# Patient Record
Sex: Female | Born: 1935 | Race: White | Hispanic: No | Smoking: Former smoker
Health system: Southern US, Community
[De-identification: ages and names within clinical notes are randomized; demographics above are authoritative.]

## PROBLEM LIST (undated history)

## (undated) DIAGNOSIS — F0390 Unspecified dementia without behavioral disturbance: Secondary | ICD-10-CM

## (undated) DIAGNOSIS — E785 Hyperlipidemia, unspecified: Secondary | ICD-10-CM

## (undated) DIAGNOSIS — F329 Major depressive disorder, single episode, unspecified: Secondary | ICD-10-CM

## (undated) DIAGNOSIS — I2510 Atherosclerotic heart disease of native coronary artery without angina pectoris: Secondary | ICD-10-CM

## (undated) DIAGNOSIS — Z950 Presence of cardiac pacemaker: Secondary | ICD-10-CM

## (undated) DIAGNOSIS — I4891 Unspecified atrial fibrillation: Secondary | ICD-10-CM

## (undated) DIAGNOSIS — I499 Cardiac arrhythmia, unspecified: Secondary | ICD-10-CM

## (undated) DIAGNOSIS — F32A Depression, unspecified: Secondary | ICD-10-CM

## (undated) HISTORY — PX: BACK SURGERY: SHX140

## (undated) HISTORY — PX: PACEMAKER INSERTION: SHX728

## (undated) HISTORY — PX: VAGINAL HYSTERECTOMY: SHX2639

---

## 2005-10-08 ENCOUNTER — Ambulatory Visit: Payer: Self-pay | Admitting: Cardiology

## 2006-06-26 ENCOUNTER — Ambulatory Visit: Payer: Self-pay

## 2015-10-28 ENCOUNTER — Emergency Department
Admission: EM | Admit: 2015-10-28 | Discharge: 2015-10-28 | Disposition: A | Discharge status: Home / Self Care | Payer: Medicare Other | Attending: Emergency Medicine | Admitting: Emergency Medicine

## 2015-10-28 ENCOUNTER — Emergency Department: Payer: Medicare Other

## 2015-10-28 ENCOUNTER — Encounter: Payer: Self-pay | Admitting: Emergency Medicine

## 2015-10-28 DIAGNOSIS — I4891 Unspecified atrial fibrillation: Secondary | ICD-10-CM | POA: Diagnosis not present

## 2015-10-28 DIAGNOSIS — Z950 Presence of cardiac pacemaker: Secondary | ICD-10-CM | POA: Diagnosis not present

## 2015-10-28 DIAGNOSIS — R410 Disorientation, unspecified: Secondary | ICD-10-CM | POA: Diagnosis present

## 2015-10-28 DIAGNOSIS — N390 Urinary tract infection, site not specified: Secondary | ICD-10-CM | POA: Insufficient documentation

## 2015-10-28 DIAGNOSIS — Z87891 Personal history of nicotine dependence: Secondary | ICD-10-CM | POA: Diagnosis not present

## 2015-10-28 HISTORY — DX: Cardiac arrhythmia, unspecified: I49.9

## 2015-10-28 HISTORY — DX: Unspecified atrial fibrillation: I48.91

## 2015-10-28 HISTORY — DX: Unspecified dementia, unspecified severity, without behavioral disturbance, psychotic disturbance, mood disturbance, and anxiety: F03.90

## 2015-10-28 HISTORY — DX: Presence of cardiac pacemaker: Z95.0

## 2015-10-28 LAB — COMPREHENSIVE METABOLIC PANEL
ALBUMIN: 3.5 g/dL (ref 3.5–5.0)
ALK PHOS: 91 U/L (ref 38–126)
ALT: 91 U/L — ABNORMAL HIGH (ref 14–54)
AST: 93 U/L — AB (ref 15–41)
Anion gap: 10 (ref 5–15)
BILIRUBIN TOTAL: 1.3 mg/dL — AB (ref 0.3–1.2)
BUN: 14 mg/dL (ref 6–20)
CALCIUM: 9.1 mg/dL (ref 8.9–10.3)
CO2: 30 mmol/L (ref 22–32)
CREATININE: 1.02 mg/dL — AB (ref 0.44–1.00)
Chloride: 97 mmol/L — ABNORMAL LOW (ref 101–111)
GFR calc Af Amer: 57 mL/min — ABNORMAL LOW (ref >60)
GFR calc non Af Amer: 49 mL/min — ABNORMAL LOW (ref >60)
GLUCOSE: 103 mg/dL — AB (ref 65–99)
Potassium: 3.2 mmol/L — ABNORMAL LOW (ref 3.5–5.1)
Sodium: 137 mmol/L (ref 135–145)
TOTAL PROTEIN: 7.3 g/dL (ref 6.5–8.1)

## 2015-10-28 LAB — URINALYSIS COMPLETE WITH MICROSCOPIC (ARMC ONLY)
BILIRUBIN URINE: NEGATIVE
Bacteria, UA: NONE SEEN
Glucose, UA: NEGATIVE mg/dL
Hgb urine dipstick: NEGATIVE
KETONES UR: NEGATIVE mg/dL
Nitrite: NEGATIVE
PROTEIN: NEGATIVE mg/dL
Specific Gravity, Urine: 1.005 (ref 1.005–1.030)
pH: 8 (ref 5.0–8.0)

## 2015-10-28 LAB — CBC
HCT: 33.6 % — ABNORMAL LOW (ref 35.0–47.0)
Hemoglobin: 11.5 g/dL — ABNORMAL LOW (ref 12.0–16.0)
MCH: 30.8 pg (ref 26.0–34.0)
MCHC: 34.1 g/dL (ref 32.0–36.0)
MCV: 90.2 fL (ref 80.0–100.0)
PLATELETS: 274 10*3/uL (ref 150–440)
RBC: 3.72 MIL/uL — ABNORMAL LOW (ref 3.80–5.20)
RDW: 16.6 % — AB (ref 11.5–14.5)
WBC: 5.8 10*3/uL (ref 3.6–11.0)

## 2015-10-28 LAB — PROTIME-INR
INR: 2.72
PROTHROMBIN TIME: 28.4 s — AB (ref 11.4–15.0)

## 2015-10-28 MED ORDER — CEPHALEXIN 500 MG PO CAPS: 500.0000 mg | ORAL_CAPSULE | Freq: Three times a day (TID) | ORAL | Status: AC

## 2015-10-28 MED ORDER — DEXTROSE 5 % IV SOLN
1.0000 g | Freq: Once | INTRAVENOUS | Status: AC
  Administered 2015-10-28: 1 g via INTRAVENOUS
  Filled 2015-10-28: qty 10

## 2015-10-28 MED ORDER — ACETAMINOPHEN 325 MG PO TABS
650.0000 mg | ORAL_TABLET | Freq: Once | ORAL | Status: AC
  Administered 2015-10-28: 650 mg via ORAL
  Filled 2015-10-28: qty 2

## 2015-10-28 NOTE — Discharge Instructions (Signed)
You would prefer to go home and be admitted to the hospital which is not unreasonable but the very vigilant. If there is confusion, high fever, nausea, vomiting, abdominal pain, or you feel worse in any way return to the emergency room. Follow closely with your doctor in 2 days to have a repeat INR checked as well as to be reevaluated.

## 2015-10-28 NOTE — ED Provider Notes (Signed)
[facility] Emergency Department Provider Note  ____________________________________________   I have reviewed the triage vital signs and the nursing notes.   HISTORY  Chief Complaint Altered Mental Status    HPI Ashley Mullins is a 82 y.o. female with a history ofdementia and urinary tract infections in the past presents today with some mild confusion this morning. She does have history of recurrent falls. She is on Coumadin. Patient denies confusion. She denies hitting her head. She does have some bruises to her arms and legs denies falling. She has no complaints, she would like to go home. Daughter states that this time she is back to her baseline but apparently she was somewhat confused when she woke up this morning. No further history. History is limited by patient dementia.  Level V chart Caveat   Past Medical History  Diagnosis Date  . Dementia   . Pacemaker   . Irregular heart beat   . A-fib (HCC)     There are no active problems to display for this patient.   Past Surgical History  Procedure Laterality Date  . Pacemaker insertion    . Vaginal hysterectomy    . Back surgery      Current Outpatient Rx  Name  Route  Sig  Dispense  Refill  . cephALEXin (KEFLEX) 500 MG capsule   Oral   Take 1 capsule (500 mg total) by mouth 3 (three) times daily.   21 capsule   0     Allergies Codeine  History reviewed. No pertinent family history.  Social History Social History  Substance Use Topics  . Smoking status: Former Games developer  . Smokeless tobacco: Never Used  . Alcohol Use: No    Review of Systems Constitutional: No fever/chills Eyes: No visual changes. ENT: No sore throat. No stiff neck no neck pain Cardiovascular: Denies chest pain. Respiratory: Denies shortness of breath. Gastrointestinal:   no vomiting.  No diarrhea.  No constipation. Genitourinary: Negative for dysuria. Musculoskeletal: Negative lower  extremity swelling Skin: Negative for rash. Neurological: Negative for headaches, focal weakness or numbness. 10-point ROS otherwise negative.  ____________________________________________   PHYSICAL EXAM:  VITAL SIGNS: ED Triage Vitals  Enc Vitals Group     BP 10/28/15 1330 140/70 mmHg     Pulse Rate 10/28/15 1326 71     Resp 10/28/15 1326 18     Temp 10/28/15 1326 97.7 F (36.5 C)     Temp Source 10/28/15 1326 Oral     SpO2 10/28/15 1326 97 %     Weight 10/28/15 1326 134 lb (60.782 kg)     Height 10/28/15 1326  (1.626 m)     Head Cir --      Peak Flow --      Pain Score 10/28/15 1328 7     Pain Loc --      Pain Edu? --      Excl. in GC? --     Constitutional: Alert and orientedTo name and place unsure of the day but knows it is Mother's Day, can identify her family members.  Well appearing and in no acute distress. Eyes: Conjunctivae are normal. PERRL. EOMI. Head: Atraumatic. Nose: No congestion/rhinnorhea. Mouth/Throat: Mucous membranes are moist.  Oropharynx non-erythematous. Neck: No stridor.   Nontender with no meningismus Cardiovascular: Normal rate, regular rhythm. Grossly normal heart sounds.  Good peripheral circulation. Respiratory: Normal respiratory effort.  No retractions. Lungs CTAB. Abdominal: Soft and nontender. No distention. No guarding no rebound  Back:  There is no focal tenderness or step off there is no midline tenderness there are no lesions noted. there is no CVA tendernes Musculoskeletal: No lower extremity tenderness. No joint effusions, no DVT signs strong distal pulses no edema Neurologic:  Normal speech and language. No gross focal neurologic deficits are appreciated.  Skin:  Skin is warm, dry and intact.  Several bruises in various stages of healing to the upper arm and one to the right medial thigh Psychiatric: Mood and affect are normal. Speech and behavior are normal.  ____________________________________________   LABS (all labs  ordered are listed, but only abnormal results are displayed)  Labs Reviewed  COMPREHENSIVE METABOLIC PANEL - Abnormal; Notable for the following:    Potassium 3.2 (*)    Chloride 97 (*)    Glucose, Bld 103 (*)    Creatinine, Ser 1.02 (*)    AST 93 (*)    ALT 91 (*)    Total Bilirubin 1.3 (*)    GFR calc non Af Amer 49 (*)    GFR calc Af Amer 57 (*)    All other components within normal limits  CBC - Abnormal; Notable for the following:    RBC 3.72 (*)    Hemoglobin 11.5 (*)    HCT 33.6 (*)    RDW 16.6 (*)    All other components within normal limits  PROTIME-INR - Abnormal; Notable for the following:    Prothrombin Time 28.4 (*)    All other components within normal limits  URINALYSIS COMPLETEWITH MICROSCOPIC ([facility] ONLY) - Abnormal; Notable for the following:    Color, Urine YELLOW (*)    APPearance CLEAR (*)    Leukocytes, UA 3+ (*)    Squamous Epithelial / LPF 0-5 (*)    All other components within normal limits  URINE CULTURE   ____________________________________________  EKG  I personally interpreted any EKGs ordered by me or triage Occasionally paced rhythm, no acute ST elevation or depression  ____________________________________________  RADIOLOGY  I reviewed any imaging ordered by me or triage that were performed during my shift and, if possible, patient and/or family made aware of any abnormal findings. ____________________________________________   PROCEDURES  Procedure(s) performed: None  Critical Care performed: None  ____________________________________________   INITIAL IMPRESSION / ASSESSMENT AND PLAN / ED COURSE  Pertinent labs & imaging results that were available during my care of the patient were reviewed by me and considered in my medical decision making (see chart for detaPatient with UTI and apparently some mild confusion which is completely resolved. I did offer admission to the hospital with family and patient would very much prefer to  go home. Vital signs and workup otherwise reassuring. We will give her antibiotics here, we will write her for Keflex. They understand this can in some ways influence her INR and they must have her INR rechecked. They will watch the patient very vigilantly for_Recurrent confusion, and they will return to the emergency room for any new or worrisome symptoms. Patient is not confused at this time and eager to go home.___________   FINAL CLINICAL IMPRESSION(S) / ED DIAGNOSES  Final diagnoses:  UTI (lower urinary tract infection)      This chart was dictated using voice recognition software.  Despite best efforts to proofread,  errors can occur which can change meaning.     Jeanmarie PlantJames A Mahoganie Basher, MD 10/28/15 [phone]

## 2015-10-28 NOTE — ED Notes (Signed)
Discussed discharge instructions, prescriptions, and follow-up care with patient and pt's family, with permission. No questions or concerns at this time. Pt stable at discharge.

## 2015-10-28 NOTE — ED Notes (Signed)
Pt presents to ED via POV with her daughter. Pt's daughter states that patient has had several falls recently and noticed that today patient seemed confused. Upon assessment pt is disoriented to time, and place, pt continues to state "I know I'm not confused" but patient is unable to answer questions at this time. Pt's daughter states that she is on coumadin.

## 2015-10-30 LAB — URINE CULTURE

## 2016-03-19 ENCOUNTER — Encounter: Payer: Self-pay | Admitting: *Deleted

## 2016-03-19 ENCOUNTER — Emergency Department
Admission: EM | Admit: 2016-03-19 | Discharge: 2016-03-19 | Disposition: A | Discharge status: Home / Self Care | Payer: Medicare Other | Attending: Student in an Organized Health Care Education/Training Program | Admitting: Student in an Organized Health Care Education/Training Program

## 2016-03-19 DIAGNOSIS — X58XXXA Exposure to other specified factors, initial encounter: Secondary | ICD-10-CM | POA: Diagnosis not present

## 2016-03-19 DIAGNOSIS — Z87891 Personal history of nicotine dependence: Secondary | ICD-10-CM | POA: Insufficient documentation

## 2016-03-19 DIAGNOSIS — R040 Epistaxis: Secondary | ICD-10-CM | POA: Diagnosis present

## 2016-03-19 DIAGNOSIS — Y939 Activity, unspecified: Secondary | ICD-10-CM | POA: Diagnosis not present

## 2016-03-19 DIAGNOSIS — S01511A Laceration without foreign body of lip, initial encounter: Secondary | ICD-10-CM | POA: Diagnosis not present

## 2016-03-19 DIAGNOSIS — Y999 Unspecified external cause status: Secondary | ICD-10-CM | POA: Diagnosis not present

## 2016-03-19 DIAGNOSIS — Y929 Unspecified place or not applicable: Secondary | ICD-10-CM | POA: Diagnosis not present

## 2016-03-19 MED ORDER — LIDOCAINE HCL 2 % EX GEL
1.0000 "application " | Freq: Once | CUTANEOUS | Status: DC
  Filled 2016-03-19: qty 5

## 2016-03-19 MED ORDER — LIDOCAINE-EPINEPHRINE 2 %-1:100000 IJ SOLN
20.0000 mL | Freq: Once | INTRAMUSCULAR | Status: DC
  Filled 2016-03-19: qty 20

## 2016-03-19 MED ORDER — LIDOCAINE-EPINEPHRINE (PF) 1 %-1:200000 IJ SOLN
INTRAMUSCULAR | Status: AC
  Filled 2016-03-19: qty 30

## 2016-03-19 NOTE — ED Triage Notes (Addendum)
[facility]. State she has been spitting up blood since this AM, pt on coumadin with hx of Afib, pt awake and alert upon arrival

## 2016-03-19 NOTE — ED Notes (Signed)
Pt resting in bed, no bleeding at present, pt awake and alert

## 2016-03-19 NOTE — ED Provider Notes (Signed)
[facility] Emergency Department Provider Note    First MD Initiated Contact with Patient 03/19/16 1743     (approximate)  I have reviewed the triage vital signs and the nursing notes.   HISTORY  Chief Complaint Epistaxis    HPI Ashley Mullins is a 82 y.o. female on Coumadin for history of A. fib presents with a chief complaint of nosebleed. States that she was coughing and spitting up blood since this morning. Denies any shortness of breath or lightheadedness. Arrives to the ER without any evidence of active bleeding from the nares. Denies any other spontaneous hemorrhage. Denies any trauma. Denies any history of previous nosebleeds.   Past Medical History:  Diagnosis Date  . A-fib (HCC)   . Dementia   . Irregular heart beat   . Pacemaker     There are no active problems to display for this patient.   Past Surgical History:  Procedure Laterality Date  . BACK SURGERY    . PACEMAKER INSERTION    . VAGINAL HYSTERECTOMY      Prior to Admission medications   Not on File    Allergies Codeine  History reviewed. No pertinent family history.  Social History Social History  Substance Use Topics  . Smoking status: Former Games developer  . Smokeless tobacco: Never Used  . Alcohol use No    Review of Systems Patient denies headaches, rhinorrhea, blurry vision, numbness, shortness of breath, chest pain, edema, cough, abdominal pain, nausea, vomiting, diarrhea, dysuria, fevers, rashes or hallucinations unless otherwise stated above in HPI. ____________________________________________   PHYSICAL EXAM:  VITAL SIGNS: Vitals:   03/19/16 1731  BP: (!) 154/62  Pulse: 66  Resp: 18  Temp: 98.1 F (36.7 C)    Constitutional: Alert and oriented. Well appearing and in no acute distress. Eyes: Conjunctivae are normal. PERRL. EOMI. Head: Atraumatic. Nose: No congestion/rhinnorhea. Evidence of epistaxis Mouth/Throat: Mucous membranes are  moist.  Oropharynx non-erythematous.  There is evidence of a less than 1 cm laceration with persistent oozing blood to the right anterior tip of the tongue.  The posterior hemorrhage. Neck: No stridor. Painless ROM. No cervical spine tenderness to palpation Hematological/Lymphatic/Immunilogical: No cervical lymphadenopathy. Cardiovascular: Normal rate, regular rhythm. Grossly normal heart sounds.  Good peripheral circulation. Respiratory: Normal respiratory effort.  No retractions. Lungs CTAB. Gastrointestinal: Soft and nontender. No distention. No abdominal bruits. No CVA tenderness. Genitourinary:  Musculoskeletal: No lower extremity tenderness nor edema.  No joint effusions. Neurologic:  Normal speech and language. No gross focal neurologic deficits are appreciated. No gait instability. Skin:  Skin is warm, dry and intact. No rash noted. Psychiatric: Mood and affect are normal. Speech and behavior are normal.  ____________________________________________   LABS (all labs ordered are listed, but only abnormal results are displayed)  No results found for this or any previous visit (from the past 24 hour(s)). ____________________________________________ ____________________________________________   PROCEDURES  Procedure(s) performed:  Procedures     Critical Care performed: no ____________________________________________   INITIAL IMPRESSION / ASSESSMENT AND PLAN / ED COURSE  Pertinent labs & imaging results that were available during my care of the patient were reviewed by me and considered in my medical decision making (see chart for details).  DDX: Epistaxis, nasal trauma, tongue laceration  Ashley Mullins is a 82 y.o. who presents to the ED with concern for epistaxis however on exam it appears more consistent that the bleeding is coming from her tongue. Plan local anesthesia and wound repair.  Clinical  Course  Comment By Time  Tongue laceration repaired at  bedside without, location. Patient tolerated well. No posterior oropharynx bleeding. Otherwise a hemostatic at this time. We'll observe for another few moments and if she remains hemostatic we'll discharge for outpatient follow-up.  Have discussed with the patient and available family all diagnostics and treatments performed thus far and all questions were answered to the best of my ability. The patient demonstrates understanding and agreement with plan.  Willy EddyPatrick Madalena Kesecker, MD 10/04 1924     ____________________________________________   FINAL CLINICAL IMPRESSION(S) / ED DIAGNOSES  Final diagnoses:  Lip laceration, initial encounter      NEW MEDICATIONS STARTED DURING THIS VISIT:  New Prescriptions   No medications on file     Note:  This document was prepared using Dragon voice recognition software and may include unintentional dictation errors.    Willy EddyPatrick Tyree Fluharty, MD 03/19/16 2021

## 2016-03-19 NOTE — Discharge Instructions (Signed)
Please follow up primary care physician. Return to the ER for worsening bleeding or concerns.

## 2016-03-24 ENCOUNTER — Encounter: Payer: Self-pay | Admitting: Emergency Medicine

## 2016-03-24 ENCOUNTER — Emergency Department
Admission: EM | Admit: 2016-03-24 | Discharge: 2016-03-24 | Disposition: A | Discharge status: Home / Self Care | Payer: Medicare Other | Attending: Emergency Medicine | Admitting: Emergency Medicine

## 2016-03-24 DIAGNOSIS — Y999 Unspecified external cause status: Secondary | ICD-10-CM | POA: Diagnosis not present

## 2016-03-24 DIAGNOSIS — Z87891 Personal history of nicotine dependence: Secondary | ICD-10-CM | POA: Insufficient documentation

## 2016-03-24 DIAGNOSIS — Y929 Unspecified place or not applicable: Secondary | ICD-10-CM | POA: Diagnosis not present

## 2016-03-24 DIAGNOSIS — W268XXA Contact with other sharp object(s), not elsewhere classified, initial encounter: Secondary | ICD-10-CM | POA: Diagnosis not present

## 2016-03-24 DIAGNOSIS — Z23 Encounter for immunization: Secondary | ICD-10-CM | POA: Insufficient documentation

## 2016-03-24 DIAGNOSIS — S61211A Laceration without foreign body of left index finger without damage to nail, initial encounter: Secondary | ICD-10-CM | POA: Diagnosis present

## 2016-03-24 DIAGNOSIS — Y9389 Activity, other specified: Secondary | ICD-10-CM | POA: Insufficient documentation

## 2016-03-24 DIAGNOSIS — Z950 Presence of cardiac pacemaker: Secondary | ICD-10-CM | POA: Diagnosis not present

## 2016-03-24 MED ORDER — TETANUS-DIPHTH-ACELL PERTUSSIS 5-2.5-18.5 LF-MCG/0.5 IM SUSP
0.5000 mL | Freq: Once | INTRAMUSCULAR | Status: AC
  Administered 2016-03-24: 0.5 mL via INTRAMUSCULAR
  Filled 2016-03-24: qty 0.5

## 2016-03-24 NOTE — ED Provider Notes (Signed)
[facility] Emergency Department Provider Note  ____________________________________________   First MD Initiated Contact with Patient 03/24/16 [phone]     (approximate)  I have reviewed the triage vital signs and the nursing notes.   HISTORY  Chief Complaint Laceration    HPI Ashley Mullins is a 82 y.o. female who takes Coumadin who presents forevaluation of a laceration to her left index finger.  She was clipping her toenails last night when the scissors slipped and cut her finger.  She washed it out thoroughly and tried to apply pressure but has continued to ooze blood during the night.  She has no other injuries.  She has no numbness nor tingling in the extremity.  The wound is relatively shallow but is just oozing blood.  Symptoms are mild, nothing makes better nor worse.     Past Medical History:  Diagnosis Date  . A-fib (HCC)   . Dementia   . Irregular heart beat   . Pacemaker     There are no active problems to display for this patient.   Past Surgical History:  Procedure Laterality Date  . BACK SURGERY    . PACEMAKER INSERTION    . VAGINAL HYSTERECTOMY      Prior to Admission medications   Not on File    Allergies Codeine  History reviewed. No pertinent family history.  Social History Social History  Substance Use Topics  . Smoking status: Former Games developer  . Smokeless tobacco: Never Used  . Alcohol use No    Review of Systems Constitutional: No fever/chills Cardiovascular: Denies chest pain. Respiratory: Denies shortness of breath. Gastrointestinal: No abdominal pain.  No nausea, no vomiting.  No diarrhea.  No constipation. Genitourinary: Negative for dysuria. Musculoskeletal: Negative for back pain. Skin: 1-cm laceration to left index finger Neurological: Negative for headaches, focal weakness or numbness.   ____________________________________________   PHYSICAL EXAM:  VITAL SIGNS: ED Triage Vitals  Enc  Vitals Group     BP 03/24/16 0604 133/65     Pulse Rate 03/24/16 0604 81     Resp 03/24/16 0604 18     Temp 03/24/16 0604 98 F (36.7 C)     Temp Source 03/24/16 0604 Oral     SpO2 03/24/16 0604 98 %     Weight 03/24/16 0605 131 lb (59.4 kg)     Height 03/24/16 0605 5\' 3"  (1.6 m)     Head Circumference --      Peak Flow --      Pain Score 03/24/16 0605 0     Pain Loc --      Pain Edu? --      Excl. in GC? --     Constitutional: Alert and oriented. Well appearing and in no acute distress. Eyes: Conjunctivae are normal. PERRL. EOMI. Head: Atraumatic. Cardiovascular: Normal rate, regular rhythm. Good peripheral circulation.  Respiratory: Normal respiratory effort.  No retractions.  Musculoskeletal: No lower extremity tenderness nor edema. No gross deformities of extremities. Neurologic:  Normal speech and language. No gross focal neurologic deficits are appreciated.  Skin:  Skin is warm, dry and intact.  1-cm shallow laceration on distal pad of left index finger. See procedure note Psychiatric: Mood and affect are normal. Speech and behavior are normal.  ____________________________________________   LABS (all labs ordered are listed, but only abnormal results are displayed)  Labs Reviewed - No data to display ____________________________________________  EKG  None - EKG not ordered by ED physician ____________________________________________  RADIOLOGY  No results found.  ____________________________________________   PROCEDURES  Procedure(s) performed:   Marland Kitchen.Marland Kitchen.Laceration Repair Date/Time: 03/24/2016 6:47 AM Performed by: Loleta RoseFORBACH, Laney Louderback Authorized by: Loleta RoseFORBACH, Mykel Sponaugle   Consent:    Consent obtained:  Verbal   Consent given by:  Patient Anesthesia (see MAR for exact dosages):    Anesthesia method:  None Laceration details:    Location:  Finger   Finger location:  L index finger   Length (cm):  1   Depth (mm):  1 Repair type:    Repair type:   Simple Exploration:    Contaminated: no   Treatment:    Amount of cleaning:  Standard   Irrigation solution:  Tap water   Visualized foreign bodies/material removed: no   Skin repair:    Repair method:  Tissue adhesive and Steri-Strips Approximation:    Vermilion border: well-aligned   Post-procedure details:    Dressing:  Open (no dressing)   Patient tolerance of procedure:  Tolerated well, no immediate complications     Critical Care performed: No ____________________________________________   INITIAL IMPRESSION / ASSESSMENT AND PLAN / ED COURSE  Pertinent labs & imaging results that were available during my care of the patient were reviewed by me and considered in my medical decision making (see chart for details).  Given that the patient is on Coumadin and oozing a little bit from the wound, I wanted to try to repair it with tissue adhesive and Steri-Strips if possible rather than introducing additional puncture wounds to her finger.  The wound is quite shallow and although it is on the finger which is somewhat atypical for adhesive and Steri-Strips, it is on the pad of her finger and not under any stress when it is bending or flexing.  Came together nicely with the tissue adhesive and I reinforced with 2 Steri-Strips and then a Band-Aid.  I made sure it is not too tight and she has normal capillary refill distally.  He gave my usual and customary recommendations and return precautions.  No indication for antibiotics.  The patient's daughter is not aware of when her last tetanus shot was and thinks it was greater than 5 years ago so I have given her a Tdap today.    ____________________________________________  FINAL CLINICAL IMPRESSION(S) / ED DIAGNOSES  Final diagnoses:  Laceration of left index finger without foreign body without damage to nail, initial encounter     MEDICATIONS GIVEN DURING THIS VISIT:  Medications  Tdap (BOOSTRIX) injection 0.5 mL (0.5 mLs  Intramuscular Given 03/24/16 0714)     NEW OUTPATIENT MEDICATIONS STARTED DURING THIS VISIT:  There are no discharge medications for this patient.   There are no discharge medications for this patient.   There are no discharge medications for this patient.    Note:  This document was prepared using Dragon voice recognition software and may include unintentional dictation errors.    Loleta Roseory Georga Stys, MD 03/24/16 [phone]

## 2016-03-24 NOTE — ED Triage Notes (Signed)
Pt was using scissors last night to clip her toenails when the scissors slipped; laceration to left index finger; pt unable to stop bleeding since incident occurred; pt takes Coumadin

## 2016-08-29 ENCOUNTER — Emergency Department
Admission: EM | Admit: 2016-08-29 | Discharge: 2016-08-29 | Disposition: A | Discharge status: Home / Self Care | Payer: Medicare Other | Attending: Emergency Medicine | Admitting: Emergency Medicine

## 2016-08-29 ENCOUNTER — Emergency Department: Payer: Medicare Other

## 2016-08-29 DIAGNOSIS — Z7901 Long term (current) use of anticoagulants: Secondary | ICD-10-CM | POA: Diagnosis not present

## 2016-08-29 DIAGNOSIS — F0391 Unspecified dementia with behavioral disturbance: Secondary | ICD-10-CM | POA: Insufficient documentation

## 2016-08-29 DIAGNOSIS — Z950 Presence of cardiac pacemaker: Secondary | ICD-10-CM | POA: Insufficient documentation

## 2016-08-29 DIAGNOSIS — Z79899 Other long term (current) drug therapy: Secondary | ICD-10-CM | POA: Diagnosis not present

## 2016-08-29 DIAGNOSIS — Z87891 Personal history of nicotine dependence: Secondary | ICD-10-CM | POA: Diagnosis not present

## 2016-08-29 DIAGNOSIS — R443 Hallucinations, unspecified: Secondary | ICD-10-CM | POA: Diagnosis present

## 2016-08-29 DIAGNOSIS — F0392 Unspecified dementia, unspecified severity, with psychotic disturbance: Secondary | ICD-10-CM

## 2016-08-29 DIAGNOSIS — Z7982 Long term (current) use of aspirin: Secondary | ICD-10-CM | POA: Diagnosis not present

## 2016-08-29 LAB — CBC
HCT: 36.5 % (ref 35.0–47.0)
HEMOGLOBIN: 12.3 g/dL (ref 12.0–16.0)
MCH: 29.6 pg (ref 26.0–34.0)
MCHC: 33.8 g/dL (ref 32.0–36.0)
MCV: 87.7 fL (ref 80.0–100.0)
PLATELETS: 246 10*3/uL (ref 150–440)
RBC: 4.16 MIL/uL (ref 3.80–5.20)
RDW: 16.3 % — ABNORMAL HIGH (ref 11.5–14.5)
WBC: 7.7 10*3/uL (ref 3.6–11.0)

## 2016-08-29 LAB — URINE DRUG SCREEN, QUALITATIVE (ARMC ONLY)
Amphetamines, Ur Screen: NOT DETECTED
Barbiturates, Ur Screen: NOT DETECTED
Benzodiazepine, Ur Scrn: NOT DETECTED
COCAINE METABOLITE, UR ~~LOC~~: NOT DETECTED
Cannabinoid 50 Ng, Ur ~~LOC~~: NOT DETECTED
MDMA (ECSTASY) UR SCREEN: NOT DETECTED
Methadone Scn, Ur: NOT DETECTED
OPIATE, UR SCREEN: NOT DETECTED
PHENCYCLIDINE (PCP) UR S: NOT DETECTED
TRICYCLIC, UR SCREEN: NOT DETECTED

## 2016-08-29 LAB — URINALYSIS, COMPLETE (UACMP) WITH MICROSCOPIC
BILIRUBIN URINE: NEGATIVE
Bacteria, UA: NONE SEEN
GLUCOSE, UA: NEGATIVE mg/dL
HGB URINE DIPSTICK: NEGATIVE
Ketones, ur: NEGATIVE mg/dL
NITRITE: NEGATIVE
PH: 7 (ref 5.0–8.0)
Protein, ur: NEGATIVE mg/dL
SPECIFIC GRAVITY, URINE: 1.005 (ref 1.005–1.030)

## 2016-08-29 LAB — PROTIME-INR
INR: 2.69
PROTHROMBIN TIME: 29.1 s — AB (ref 11.4–15.2)

## 2016-08-29 LAB — ETHANOL: Alcohol, Ethyl (B): <5 mg/dL (ref <5)

## 2016-08-29 LAB — TSH: TSH: 1.528 u[IU]/mL (ref 0.350–4.500)

## 2016-08-29 LAB — BASIC METABOLIC PANEL
ANION GAP: 8 (ref 5–15)
BUN: 20 mg/dL (ref 6–20)
CHLORIDE: 102 mmol/L (ref 101–111)
CO2: 29 mmol/L (ref 22–32)
Calcium: 8.9 mg/dL (ref 8.9–10.3)
Creatinine, Ser: 1.34 mg/dL — ABNORMAL HIGH (ref 0.44–1.00)
GFR calc Af Amer: 41 mL/min — ABNORMAL LOW (ref >60)
GFR calc non Af Amer: 35 mL/min — ABNORMAL LOW (ref >60)
Glucose, Bld: 116 mg/dL — ABNORMAL HIGH (ref 65–99)
POTASSIUM: 3.3 mmol/L — AB (ref 3.5–5.1)
Sodium: 139 mmol/L (ref 135–145)

## 2016-08-29 MED ORDER — SODIUM CHLORIDE 0.9 % IV BOLUS (SEPSIS)
1000.0000 mL | Freq: Once | INTRAVENOUS | Status: AC
  Administered 2016-08-29: 1000 mL via INTRAVENOUS

## 2016-08-29 MED ORDER — QUETIAPINE FUMARATE 50 MG PO TABS
50.0000 mg | ORAL_TABLET | Freq: Every day | ORAL | 0 refills | Status: DC
Start: 2016-08-29 — End: 2017-03-26

## 2016-08-29 NOTE — ED Triage Notes (Signed)
Pt arrived via ems - she states she called ems because her daughter told her to - she stated that her daughter was bossing her around and they were arguing and then the daughter told her that she was having hallucinations - pt denies hallucinations - pt son in law told the ems that if we did not admit the pt that she was suicidal - pt denies SI or HI

## 2016-08-29 NOTE — ED Provider Notes (Signed)
-----------------------------------------   4:17 PM on 08/29/2016 -----------------------------------------  Patient care is him from Dr. Judithann GravesFarrar niece. Psychiatry is seeing the patient and have increase the Seroquel dosing. Family is agreeable to take the patient home and follow up with Dr. Derrill KayBrownstein. Patient's medical workup is been largely nonrevealing. We will discharge home with PCP follow-up.   Minna AntisKevin Eddie Koc, MD 08/29/16 [phone]

## 2016-08-29 NOTE — Consult Note (Signed)
[facility] Face-to-Face Psychiatry Consult   Reason for Consult:  Consult for 83 year old woman brought by her family to the emergency room because of worsening hallucinations and psychotic behavior Referring Physician:  Reita Cliche Patient Identification: Ashley Mullins MRN:  [id number] Principal Diagnosis: Dementia with psychosis Diagnosis:   Patient Active Problem List   Diagnosis Date Noted  . Dementia with psychosis [F03.91] 08/29/2016    Total Time spent with patient: 1 hour  Subjective:   Ashley Mullins is a 83 y.o. female patient admitted with "I don't know why I'm here".  HPI:  Patient interviewed. More information obtained from speaking with her daughter and son-in-law. 36 year old woman who lives in an assisted living but lives independently. Daughter and son in law reports that she's had worsening and more frequent episodes of hallucinations and agitated behavior. The patient only recounts one episode of an and makes it sound fairly benign. She says that she saw a woman, whom she recognizes, in her house eating some food and that she told the woman to go away. She claims this only happened one time. Daughter reports that it's been happening pretty frequently. She's had multiple episodes of seeing people in her apartment who aren't there. It seems pretty clear from what they say that these things are not real and that they've been going on probably for more than a year. She's had some spells of agitation and disruptive behavior at the assisted living facility as well. Patient has no insight into any of this. She denies any mood or psychotic symptoms. Denies suicidality. Patient has been to see a gerontologist who started her on 25 mg of Seroquel at night. Daughter does not think that has had any effect so far.  Social history: Patient lives in an independent section of an assisted living facility. Daughter and son-in-law appear to be the closest relatives and are in frequent contact  with her.  Medical history: Atrial fibrillation on anticoagulation  Substance abuse history: Denies any current or past alcohol or drug abuse  Past Psychiatric History: Patient denies any past psychiatric history at all. No history of suicide attempts or violence. In private the daughter tells me that the patient has always been a difficult personality with a lot of anger and irritability. When the daughter was young the patient used to do things like leave suicide notes when she wanted to manipulate the family and one way or another. No history however of known treatment.  Risk to Self: Suicidal Ideation: No Suicidal Intent: No Is patient at risk for suicide?: No Suicidal Plan?: No Access to Means: No What has been your use of drugs/alcohol within the last 12 months?: Reports of none How many times?: 0 Other Self Harm Risks: Reports of none Triggers for Past Attempts: None known Intentional Self Injurious Behavior: None Risk to Others: Homicidal Ideation: No Thoughts of Harm to Others: No Current Homicidal Intent: No Current Homicidal Plan: No Access to Homicidal Means: No Identified Victim: Reports of none History of harm to others?: No Assessment of Violence: None Noted Violent Behavior Description: Reports of none Does patient have access to weapons?: No Criminal Charges Pending?: No Does patient have a court date: No Prior Inpatient Therapy: Prior Inpatient Therapy: No (Reports of none) Prior Therapy Dates: Reports of none Prior Therapy Facilty/Provider(s): Reports of none Reason for Treatment: Reports of none Prior Outpatient Therapy: Prior Outpatient Therapy: No Prior Therapy Dates: Reports of none Prior Therapy Facilty/Provider(s): Reports of none Reason for Treatment: Reports of none Does  patient have an ACCT team?: No Does patient have Intensive In-House Services?  : No Does patient have [facility] services? : No Does patient have P4CC services?: No  Past Medical  History:  Past Medical History:  Diagnosis Date  . A-fib ([place])   . Dementia   . Irregular heart beat   . Pacemaker     Past Surgical History:  Procedure Laterality Date  . BACK SURGERY    . PACEMAKER INSERTION    . VAGINAL HYSTERECTOMY     Family History: No family history on file. Family Psychiatric  History: None known except that she had a sister, now deceased, who was described as having a personality just like hers. Social History:  History  Alcohol Use No     History  Drug Use No    Social History   Social History  . Marital status: Married    Spouse name: N/A  . Number of children: N/A  . Years of education: N/A   Social History Main Topics  . Smoking status: Former Research scientist (life sciences)  . Smokeless tobacco: Never Used  . Alcohol use No  . Drug use: No  . Sexual activity: Not Asked   Other Topics Concern  . None   Social History Narrative  . None   Additional Social History:    Allergies:   Allergies  Allergen Reactions  . Codeine     Labs:  Results for orders placed or performed during the hospital encounter of 08/29/16 (from the past 48 hour(s))  CBC     Status: Abnormal   Collection Time: 08/29/16 12:51 PM  Result Value Ref Range   WBC 7.7 3.6 - 11.0 K/uL   RBC 4.16 3.80 - 5.20 MIL/uL   Hemoglobin 12.3 12.0 - 16.0 g/dL   HCT 36.5 35.0 - 47.0 %   MCV 87.7 80.0 - 100.0 fL   MCH 29.6 26.0 - 34.0 pg   MCHC 33.8 32.0 - 36.0 g/dL   RDW 16.3 (H) 11.5 - 14.5 %   Platelets 246 150 - 440 K/uL  Basic metabolic panel     Status: Abnormal   Collection Time: 08/29/16 12:51 PM  Result Value Ref Range   Sodium 139 135 - 145 mmol/L   Potassium 3.3 (L) 3.5 - 5.1 mmol/L   Chloride 102 101 - 111 mmol/L   CO2 29 22 - 32 mmol/L   Glucose, Bld 116 (H) 65 - 99 mg/dL   BUN 20 6 - 20 mg/dL   Creatinine, Ser 1.34 (H) 0.44 - 1.00 mg/dL   Calcium 8.9 8.9 - 10.3 mg/dL   GFR calc non Af Amer 35 (L) >60 mL/min   GFR calc Af Amer 41 (L) >60 mL/min    Comment: (NOTE) The  eGFR has been calculated using the CKD EPI equation. This calculation has not been validated in all clinical situations. eGFR's persistently <60 mL/min signify possible Chronic Kidney Disease.    Anion gap 8 5 - 15  Ethanol     Status: None   Collection Time: 08/29/16 12:51 PM  Result Value Ref Range   Alcohol, Ethyl (B) <5 <5 mg/dL    Comment:        LOWEST DETECTABLE LIMIT FOR SERUM ALCOHOL IS 5 mg/dL FOR MEDICAL PURPOSES ONLY   TSH     Status: None   Collection Time: 08/29/16 12:51 PM  Result Value Ref Range   TSH 1.528 0.350 - 4.500 uIU/mL    Comment: Performed by a 3rd Generation assay  with a functional sensitivity of <=0.01 uIU/mL.  Protime-INR     Status: Abnormal   Collection Time: 08/29/16 12:51 PM  Result Value Ref Range   Prothrombin Time 29.1 (H) 11.4 - 15.2 seconds   INR 2.69   Urinalysis, Complete w Microscopic     Status: Abnormal   Collection Time: 08/29/16  1:42 PM  Result Value Ref Range   Color, Urine STRAW (A) YELLOW   APPearance CLEAR (A) CLEAR   Specific Gravity, Urine 1.005 1.005 - 1.030   pH 7.0 5.0 - 8.0   Glucose, UA NEGATIVE NEGATIVE mg/dL   Hgb urine dipstick NEGATIVE NEGATIVE   Bilirubin Urine NEGATIVE NEGATIVE   Ketones, ur NEGATIVE NEGATIVE mg/dL   Protein, ur NEGATIVE NEGATIVE mg/dL   Nitrite NEGATIVE NEGATIVE   Leukocytes, UA SMALL (A) NEGATIVE   RBC / HPF 0-5 0 - 5 RBC/hpf   WBC, UA 6-30 0 - 5 WBC/hpf   Bacteria, UA NONE SEEN NONE SEEN   Squamous Epithelial / LPF 0-5 (A) NONE SEEN   Mucous PRESENT    Hyaline Casts, UA PRESENT   Urine Drug Screen, Qualitative ([facility] only)     Status: None   Collection Time: 08/29/16  1:42 PM  Result Value Ref Range   Tricyclic, Ur Screen NONE DETECTED NONE DETECTED   Amphetamines, Ur Screen NONE DETECTED NONE DETECTED   MDMA (Ecstasy)Ur Screen NONE DETECTED NONE DETECTED   Cocaine Metabolite,Ur [place] NONE DETECTED NONE DETECTED   Opiate, Ur Screen NONE DETECTED NONE DETECTED   Phencyclidine (PCP) Ur S  NONE DETECTED NONE DETECTED   Cannabinoid 50 Ng, Ur  NONE DETECTED NONE DETECTED   Barbiturates, Ur Screen NONE DETECTED NONE DETECTED   Benzodiazepine, Ur Scrn NONE DETECTED NONE DETECTED   Methadone Scn, Ur NONE DETECTED NONE DETECTED    Comment: (NOTE) 630  Tricyclics, urine               Cutoff 1000 ng/mL 200  Amphetamines, urine             Cutoff 1000 ng/mL 300  MDMA (Ecstasy), urine           Cutoff 500 ng/mL 400  Cocaine Metabolite, urine       Cutoff 300 ng/mL 500  Opiate, urine                   Cutoff 300 ng/mL 600  Phencyclidine (PCP), urine      Cutoff 25 ng/mL 700  Cannabinoid, urine              Cutoff 50 ng/mL 800  Barbiturates, urine             Cutoff 200 ng/mL 900  Benzodiazepine, urine           Cutoff 200 ng/mL 1000 Methadone, urine                Cutoff 300 ng/mL 1100 1200 The urine drug screen provides only a preliminary, unconfirmed 1300 analytical test result and should not be used for non-medical 1400 purposes. Clinical consideration and professional judgment should 1500 be applied to any positive drug screen result due to possible 1600 interfering substances. A more specific alternate chemical method 1700 must be used in order to obtain a confirmed analytical result.  1800 Gas chromato graphy / mass spectrometry (GC/MS) is the preferred 1900 confirmatory method.     No current facility-administered medications for this encounter.    Current Outpatient Prescriptions  Medication Sig  Dispense Refill  . amLODipine (NORVASC) 5 MG tablet Take 5 mg by mouth daily.    Marland Kitchen aspirin EC 81 MG tablet Take 81 mg by mouth daily.    Marland Kitchen atorvastatin (LIPITOR) 80 MG tablet Take 80 mg by mouth daily.    . calcium carbonate (CALCIUM 600) 1500 (600 Ca) MG TABS tablet Take 1,500 mg by mouth 2 (two) times daily with a meal.    . cholecalciferol (VITAMIN D) 1000 units tablet Take 1,000 Units by mouth daily.    . diphenhydrAMINE (BENADRYL) 25 mg capsule Take 25 mg by mouth  every 6 (six) hours as needed.    . furosemide (LASIX) 40 MG tablet Take 40 mg by mouth daily.    . metoprolol (LOPRESSOR) 50 MG tablet Take 50 mg by mouth 2 (two) times daily.     . nitroGLYCERIN (NITROSTAT) 0.4 MG SL tablet Place 0.4 mg under the tongue every 5 (five) minutes as needed for chest pain.    . potassium chloride (K-DUR,KLOR-CON) 10 MEQ tablet Take 10 mEq by mouth daily.    . QUEtiapine (SEROQUEL) 25 MG tablet Take 25 mg by mouth at bedtime.    . vitamin C (ASCORBIC ACID) 500 MG tablet Take 500 mg by mouth daily.    Marland Kitchen warfarin (COUMADIN) 2 MG tablet Take 1-2 mg by mouth daily. Pt alternates take 1 tab qd and 0.5 tab the next    . QUEtiapine (SEROQUEL) 50 MG tablet Take 1 tablet (50 mg total) by mouth at bedtime. 1 at night for 3 days then increase to 2 pills (179m) at night thereafter 60 tablet 0    Musculoskeletal: Strength & Muscle Tone: decreased Gait & Station: normal Patient leans: N/A  Psychiatric Specialty Exam: Physical Exam  Nursing note and vitals reviewed. Constitutional: She appears well-developed and well-nourished.  HENT:  Head: Normocephalic and atraumatic.  Eyes: Conjunctivae are normal. Pupils are equal, round, and reactive to light.  Neck: Normal range of motion.  Cardiovascular: Regular rhythm and normal heart sounds.   Respiratory: Effort normal. No respiratory distress.  GI: Soft.  Musculoskeletal: Normal range of motion.  Neurological: She is alert.  Skin: Skin is warm and dry.  Psychiatric: Her affect is labile. Her speech is tangential. Thought content is paranoid. She expresses impulsivity. She expresses no homicidal and no suicidal ideation. She exhibits abnormal recent memory. She is inattentive.    Review of Systems  Constitutional: Negative.   HENT: Negative.   Eyes: Negative.   Respiratory: Negative.   Cardiovascular: Negative.   Gastrointestinal: Negative.   Musculoskeletal: Negative.   Skin: Negative.   Neurological: Negative.    Psychiatric/Behavioral: Positive for hallucinations. Negative for depression, memory loss, substance abuse and suicidal ideas. The patient is not nervous/anxious and does not have insomnia.     Blood pressure 138/67, pulse 71, temperature 98 F (36.7 C), temperature source Oral, resp. rate 16, height 5' 4" (1.626 m), weight 63.5 kg (140 lb), SpO2 96 %.Body mass index is 24.03 kg/m.  General Appearance: Casual  Eye Contact:  Fair  Speech:  Slow  Volume:  Decreased  Mood:  Euthymic  Affect:  Constricted  Thought Process:  Disorganized  Orientation:  Full (Time, Place, and Person)  Thought Content:  Delusions, Hallucinations: Visual and Paranoid Ideation  Suicidal Thoughts:  No  Homicidal Thoughts:  No  Memory:  Immediate;   Fair Recent;   Poor Remote;   Fair  Judgement:  Impaired  Insight:  Shallow  Psychomotor Activity:  Decreased  Concentration:  Concentration: Fair  Recall:  [employer] of Knowledge:  Fair  Language:  Fair  Akathisia:  No  Handed:  Right  AIMS (if indicated):     Assets:  Housing Social Support  ADL's:  Intact  Cognition:  Impaired,  Mild  Sleep:        Treatment Plan Summary: Medication management and Plan 83 year old woman who is presenting with worsening symptoms of visual hallucinations and agitation. No clear sign of a specific medical abnormality. She may have a slight urinary tract infection but it's borderline and it's not clear that it needs treatment right now. Head CT doesn't show any new or remarkable abnormality. I discussed with the daughter and son-in-law possible options. One extreme option would be to refer her to a geriatric psychiatry hospital however I suggested that since she currently has a safe place to live that we try increasing her medicine for. I will give them a prescription for Seroquel with directions to increase the dose to 50 mg at night for 2 nights and then if tolerated go up to 100 mg at night. They should follow-up with the  gerontologist they have seen already. Education provided. Case reviewed with the ER physician. Patient can be released home.  Disposition: Patient does not meet criteria for psychiatric inpatient admission. Supportive therapy provided about ongoing stressors.  Alethia Berthold, MD 08/29/2016 6:57 PM

## 2016-08-29 NOTE — ED Notes (Signed)
Pt arrived via ems - she states she called ems because her daughter told her to - she stated that her daughter was bossing her around and they were arguing and then the daughter told her that she was having hallucinations - pt denies hallucinations - pt son in law told the ems that if we did not admit the pt that she was suicidal - pt denies SI or HI - according to the family pt was started on new medication 3 weeks ago for hallucinations but that it has stopped working in the last 2 days
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
## 2016-08-31 LAB — URINE CULTURE

## 2017-01-03 ENCOUNTER — Emergency Department
Admission: EM | Admit: 2017-01-03 | Discharge: 2017-01-06 | Disposition: A | Discharge status: Home / Self Care | Payer: Medicare Other | Attending: Emergency Medicine | Admitting: Emergency Medicine

## 2017-01-03 ENCOUNTER — Encounter: Payer: Self-pay | Admitting: Medical Oncology

## 2017-01-03 DIAGNOSIS — Z7901 Long term (current) use of anticoagulants: Secondary | ICD-10-CM | POA: Insufficient documentation

## 2017-01-03 DIAGNOSIS — Z87891 Personal history of nicotine dependence: Secondary | ICD-10-CM | POA: Diagnosis not present

## 2017-01-03 DIAGNOSIS — Z7982 Long term (current) use of aspirin: Secondary | ICD-10-CM | POA: Diagnosis not present

## 2017-01-03 DIAGNOSIS — F0391 Unspecified dementia with behavioral disturbance: Secondary | ICD-10-CM | POA: Insufficient documentation

## 2017-01-03 DIAGNOSIS — Z79899 Other long term (current) drug therapy: Secondary | ICD-10-CM | POA: Insufficient documentation

## 2017-01-03 DIAGNOSIS — R443 Hallucinations, unspecified: Secondary | ICD-10-CM

## 2017-01-03 DIAGNOSIS — F0392 Unspecified dementia, unspecified severity, with psychotic disturbance: Secondary | ICD-10-CM

## 2017-01-03 LAB — CBC WITH DIFFERENTIAL/PLATELET
Basophils Absolute: 0 10*3/uL (ref 0–0.1)
Basophils Relative: 0 %
EOS ABS: 0.1 10*3/uL (ref 0–0.7)
EOS PCT: 1 %
HCT: 36.3 % (ref 35.0–47.0)
HEMOGLOBIN: 12.1 g/dL (ref 12.0–16.0)
LYMPHS ABS: 1.5 10*3/uL (ref 1.0–3.6)
LYMPHS PCT: 26 %
MCH: 29.8 pg (ref 26.0–34.0)
MCHC: 33.4 g/dL (ref 32.0–36.0)
MCV: 89.1 fL (ref 80.0–100.0)
MONOS PCT: 9 %
Monocytes Absolute: 0.5 10*3/uL (ref 0.2–0.9)
Neutro Abs: 3.5 10*3/uL (ref 1.4–6.5)
Neutrophils Relative %: 64 %
PLATELETS: 219 10*3/uL (ref 150–440)
RBC: 4.07 MIL/uL (ref 3.80–5.20)
RDW: 15.6 % — ABNORMAL HIGH (ref 11.5–14.5)
WBC: 5.6 10*3/uL (ref 3.6–11.0)

## 2017-01-03 LAB — URINE DRUG SCREEN, QUALITATIVE (ARMC ONLY)
AMPHETAMINES, UR SCREEN: NOT DETECTED
Barbiturates, Ur Screen: NOT DETECTED
Benzodiazepine, Ur Scrn: NOT DETECTED
Cannabinoid 50 Ng, Ur ~~LOC~~: NOT DETECTED
Cocaine Metabolite,Ur ~~LOC~~: NOT DETECTED
MDMA (ECSTASY) UR SCREEN: NOT DETECTED
METHADONE SCREEN, URINE: NOT DETECTED
Opiate, Ur Screen: NOT DETECTED
Phencyclidine (PCP) Ur S: NOT DETECTED
TRICYCLIC, UR SCREEN: NOT DETECTED

## 2017-01-03 LAB — COMPREHENSIVE METABOLIC PANEL
ALBUMIN: 3.6 g/dL (ref 3.5–5.0)
ALK PHOS: 140 U/L — AB (ref 38–126)
ALT: 18 U/L (ref 14–54)
AST: 26 U/L (ref 15–41)
Anion gap: 9 (ref 5–15)
BUN: 15 mg/dL (ref 6–20)
CALCIUM: 8.8 mg/dL — AB (ref 8.9–10.3)
CO2: 28 mmol/L (ref 22–32)
CREATININE: 1.11 mg/dL — AB (ref 0.44–1.00)
Chloride: 105 mmol/L (ref 101–111)
GFR calc Af Amer: 51 mL/min — ABNORMAL LOW (ref >60)
GFR calc non Af Amer: 44 mL/min — ABNORMAL LOW (ref >60)
GLUCOSE: 108 mg/dL — AB (ref 65–99)
Potassium: 3.7 mmol/L (ref 3.5–5.1)
Sodium: 142 mmol/L (ref 135–145)
Total Bilirubin: 0.7 mg/dL (ref 0.3–1.2)
Total Protein: 7.2 g/dL (ref 6.5–8.1)

## 2017-01-03 LAB — URINALYSIS, COMPLETE (UACMP) WITH MICROSCOPIC
BACTERIA UA: NONE SEEN
Bilirubin Urine: NEGATIVE
GLUCOSE, UA: NEGATIVE mg/dL
Hgb urine dipstick: NEGATIVE
Ketones, ur: NEGATIVE mg/dL
Nitrite: NEGATIVE
PROTEIN: NEGATIVE mg/dL
RBC / HPF: NONE SEEN RBC/hpf (ref 0–5)
Specific Gravity, Urine: 1.008 (ref 1.005–1.030)
pH: 7 (ref 5.0–8.0)

## 2017-01-03 LAB — ETHANOL: Alcohol, Ethyl (B): <5 mg/dL (ref <5)

## 2017-01-03 NOTE — ED Notes (Signed)
Report given to Dr. Sprague 

## 2017-01-03 NOTE — ED Notes (Signed)
[facility]
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
## 2017-01-03 NOTE — ED Notes (Signed)
ENVIRONMENTAL ASSESSMENT  Potentially harmful objects out of patient reach: Yes.  Personal belongings secured: Yes.  Patient dressed in hospital provided attire only: Yes.  Plastic bags out of patient reach: Yes.  Patient care equipment (cords, cables, call bells, lines, and drains) shortened, removed, or accounted for: Yes.  Equipment and supplies removed from bottom of stretcher: Yes.  Potentially toxic materials out of patient reach: Yes.  Sharps container removed or out of patient reach: Yes.   BEHAVIORAL HEALTH ROUNDING  Patient sleeping: No.  Patient alert and oriented: to self only which is her baseline Behavior appropriate: Yes. ; If no, describe:  Nutrition and fluids offered: Yes  Toileting and hygiene offered: Yes  Sitter present: not applicable, Q 15 min safety rounds and observation.  Law enforcement present: Yes ODS  ED BHU PLACEMENT JUSTIFICATION  Is the patient under IVC or is there intent for IVC: Yes.  Is the patient medically cleared: Yes.  Is there vacancy in the ED BHU: Yes.  Is the population mix appropriate for patient: No.  Is the patient awaiting placement in inpatient or outpatient setting: Yes.  Has the patient had a psychiatric consult: Yes.  Survey of unit performed for contraband, proper placement and condition of furniture, tampering with fixtures in bathroom, shower, and each patient room: Yes. ; Findings: All clear  APPEARANCE/BEHAVIOR  calm, cooperative at this time NEURO ASSESSMENT  Orientation: self only which is her baseline Hallucinations: No.None noted (Hallucinations)  Speech: Normal  Gait: unsteady RESPIRATORY ASSESSMENT  WNL  CARDIOVASCULAR ASSESSMENT  WNL  GASTROINTESTINAL ASSESSMENT  WNL  EXTREMITIES  WNL  PLAN OF CARE  Provide calm/safe environment. Vital signs assessed TID. ED BHU Assessment once each 12-hour shift. Collaborate with TTS daily or as condition indicates. Assure the ED provider has rounded once each shift. Provide  and encourage hygiene. Provide redirection as needed. Assess for escalating behavior; address immediately and inform ED provider.  Assess family dynamic and appropriateness for visitation as needed: Yes. ; If necessary, describe findings:  Educate the patient/family about BHU procedures/visitation: Yes. ; If necessary, describe findings: Pt is calm and cooperative at this time. Will continue to monitor with Q 15 min safety rounds and observation.

## 2017-01-03 NOTE — ED Notes (Signed)
BEHAVIORAL HEALTH ROUNDING  Patient sleeping: No.  Patient alert and oriented: alert, oriented to self only which is her baseline  Behavior appropriate: Yes. ; If no, describe:  Nutrition and fluids offered: Yes  Toileting and hygiene offered: Yes  Sitter present: not applicable, Q 15 min safety rounds and observation.  Law enforcement present: Yes ODS
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
## 2017-01-03 NOTE — Progress Notes (Signed)
CSW consult acknowledged re: "dementia, may need placement in dementia unit if released by psychiatry". Patient with Medicare insurance and without a qualifying 3 midnight inpatient hospital stay or without a qualifying 3 midnight hospitalization in the past 30 days as required for Medicare SNF coverage. Patient also without Long Term Care Medicaid needed for Assisted Living Facility placement. Patient does have AARP supplemental insurance, however, that only covers Skilled Nursing Facility coinsurance or copayment once Medicare coverage ends at the facility. CSW has explained this information to Patient's daughter.    CSW explored private pay facility options with Patient's daughter, Ashley Mullins [phone]). Patient's daughter reports that they are unable to pay privately for a facility at this time. CSW encouraged Patient's daughter to go to Department of Social Services as soon as possible to apply for the special assistance/LTC Medicaid needed for facility placement. Patient may also benefit from home health (Child psychotherapistsocial worker, Charity fundraiserN, and aide).   Per MD, Patient had previously been cleared by Psychiatry but has since stated that she was suicidal. MD to IVC patient and have her re-evaluated by Psychiatry. CSW continues to follow.   Enos FlingAshley Gazella Anglin, MSW, LCSW [facility] Clinical Social Worker [phone]

## 2017-01-03 NOTE — ED Notes (Signed)
BEHAVIORAL HEALTH ROUNDING  Patient sleeping: No.  Patient alert and oriented: yes to her baseline Behavior appropriate: Yes. ; If no, describe:  Nutrition and fluids offered: Yes  Toileting and hygiene offered: Yes  Sitter present: not applicable, Q 15 min safety rounds and observation.  Law enforcement present: Yes ODS  

## 2017-01-03 NOTE — ED Notes (Signed)
PT  IVC  PENDING  SOCIAL WORK CONSULT 

## 2017-01-03 NOTE — ED Notes (Signed)
BEHAVIORAL HEALTH ROUNDING Patient sleeping: Yes.   Patient alert and oriented: not applicable SLEEPING Behavior appropriate: Yes.  ; If no, describe: SLEEPING Nutrition and fluids offered: No SLEEPING Toileting and hygiene offered: NoSLEEPING Sitter present: not applicable, Q 15 min safety rounds and observation. Law enforcement present: Yes ODS 

## 2017-01-03 NOTE — ED Notes (Signed)
BH Counselor has spoke with Dr. Shaune PollackLord who has shared that [facility] the [facility] does not meet inpatient criteria at this time and has reccommended discharging the pt. EDP reports that the pt is not appropriate to be discharged home at this time and plans to IVC the pt.  TTS has also recommended that a Social Work consult be requested.

## 2017-01-04 DIAGNOSIS — R443 Hallucinations, unspecified: Secondary | ICD-10-CM | POA: Diagnosis not present

## 2017-01-04 LAB — PROTIME-INR
INR: 1.06
Prothrombin Time: 13.8 s (ref 11.4–15.2)

## 2017-01-04 MED ORDER — VITAMIN C 500 MG PO TABS
500.0000 mg | ORAL_TABLET | Freq: Every day | ORAL | Status: DC
  Administered 2017-01-04 – 2017-01-05 (×2): 500 mg via ORAL
  Filled 2017-01-04 (×3): qty 1

## 2017-01-04 MED ORDER — METOPROLOL TARTRATE 50 MG PO TABS
50.0000 mg | ORAL_TABLET | Freq: Two times a day (BID) | ORAL | Status: DC
  Administered 2017-01-04 – 2017-01-06 (×5): 50 mg via ORAL
  Filled 2017-01-04 (×5): qty 1

## 2017-01-04 MED ORDER — AMLODIPINE BESYLATE 5 MG PO TABS
5.0000 mg | ORAL_TABLET | Freq: Every day | ORAL | Status: DC
  Administered 2017-01-04 – 2017-01-06 (×3): 5 mg via ORAL
  Filled 2017-01-04 (×3): qty 1

## 2017-01-04 MED ORDER — CALCIUM CARBONATE 1500 (600 CA) MG PO TABS: 1500.0000 mg | ORAL_TABLET | Freq: Two times a day (BID) | ORAL | Status: DC

## 2017-01-04 MED ORDER — CALCIUM CARBONATE ANTACID 500 MG PO CHEW
3.0000 | CHEWABLE_TABLET | Freq: Two times a day (BID) | ORAL | Status: DC
  Administered 2017-01-05 – 2017-01-06 (×3): 600 mg via ORAL
  Filled 2017-01-04 (×3): qty 3

## 2017-01-04 MED ORDER — POTASSIUM CHLORIDE CRYS ER 20 MEQ PO TBCR
10.0000 meq | EXTENDED_RELEASE_TABLET | Freq: Every day | ORAL | Status: DC
  Administered 2017-01-04 – 2017-01-06 (×3): 10 meq via ORAL
  Filled 2017-01-04 (×4): qty 1

## 2017-01-04 MED ORDER — WARFARIN SODIUM 2 MG PO TABS
2.0000 mg | ORAL_TABLET | Freq: Every day | ORAL | Status: DC
  Filled 2017-01-04 (×2): qty 1

## 2017-01-04 MED ORDER — ATORVASTATIN CALCIUM 20 MG PO TABS
80.0000 mg | ORAL_TABLET | Freq: Every day | ORAL | Status: DC
  Administered 2017-01-04 – 2017-01-06 (×3): 80 mg via ORAL
  Filled 2017-01-04 (×3): qty 4

## 2017-01-04 MED ORDER — FUROSEMIDE 40 MG PO TABS
40.0000 mg | ORAL_TABLET | Freq: Every day | ORAL | Status: DC
  Administered 2017-01-04 – 2017-01-06 (×3): 40 mg via ORAL
  Filled 2017-01-04 (×3): qty 1

## 2017-01-04 MED ORDER — NITROGLYCERIN 0.4 MG SL SUBL: 0.4000 mg | SUBLINGUAL_TABLET | SUBLINGUAL | Status: DC | PRN

## 2017-01-04 MED ORDER — DIPHENHYDRAMINE HCL 25 MG PO CAPS: 25.0000 mg | ORAL_CAPSULE | Freq: Four times a day (QID) | ORAL | Status: DC | PRN

## 2017-01-04 MED ORDER — VITAMIN D 1000 UNITS PO TABS
1000.0000 [IU] | ORAL_TABLET | Freq: Every day | ORAL | Status: DC
  Administered 2017-01-04 – 2017-01-06 (×3): 1000 [IU] via ORAL
  Filled 2017-01-04 (×3): qty 1

## 2017-01-04 MED ORDER — QUETIAPINE FUMARATE 25 MG PO TABS
50.0000 mg | ORAL_TABLET | Freq: Every day | ORAL | Status: DC
  Administered 2017-01-04 – 2017-01-05 (×2): 50 mg via ORAL
  Filled 2017-01-04 (×2): qty 2

## 2017-01-04 MED ORDER — ASPIRIN EC 81 MG PO TBEC
81.0000 mg | DELAYED_RELEASE_TABLET | Freq: Every day | ORAL | Status: DC
  Administered 2017-01-04 – 2017-01-05 (×2): 81 mg via ORAL
  Filled 2017-01-04 (×2): qty 1

## 2017-01-04 MED ORDER — WARFARIN - PHYSICIAN DOSING INPATIENT
Freq: Every day | Status: DC
  Filled 2017-01-04 (×4): qty 1

## 2017-01-04 NOTE — ED Notes (Signed)
Pt ate all of breakfast.  

## 2017-01-04 NOTE — ED Notes (Signed)
Patient stated that she did not want to take a shower at this time.

## 2017-01-04 NOTE — ED Notes (Signed)
Gave meal tray

## 2017-01-04 NOTE — ED Provider Notes (Signed)
-----------------------------------------   7:22 AM on 01/04/2017 -----------------------------------------   Blood pressure (!) 156/68, pulse 73, temperature 98.1 F (36.7 C), temperature source Oral, resp. rate 15, height 5\' 5"  (1.651 m), weight 67.1 kg (148 lb), SpO2 96 %.  The patient had no acute events since last update.  Calm and cooperative at this time.  Disposition is pending Psychiatry/Behavioral Medicine team recommendations.       Irean HongSung, Jade J, MD 01/04/17 [phone]

## 2017-01-04 NOTE — ED Notes (Signed)
Assisted the patient to the restroom. She had a bowel movement and is now back in the bed resting.

## 2017-01-04 NOTE — ED Notes (Signed)
Patient has breakfast tray and is eating at this time. 

## 2017-01-04 NOTE — ED Notes (Signed)
Report from Michelle, RN.

## 2017-01-04 NOTE — ED Notes (Signed)
Gave patient blankets and ice cream.

## 2017-01-04 NOTE — ED Notes (Signed)
Report from susan, rn.  

## 2017-01-04 NOTE — ED Notes (Signed)
Helped patient to toilet and back to bed.AS

## 2017-01-04 NOTE — ED Notes (Signed)
Pt walking around her bed folding the bed linens. Recliner placed in room for pt.

## 2017-01-04 NOTE — ED Notes (Signed)
Fall mats x3 placed around bed, yellow fall risk bracelet in place and yellow socks placed on pt's feet. siderails up x2, walker at bedside. Posey fall alarm mat placed beneath pt. Will move pt to hospital bed when she awakes, currently is sleeping.

## 2017-01-04 NOTE — ED Notes (Signed)
Pt ambulated to bathroom with walker and tech assisting

## 2017-01-04 NOTE — ED Notes (Signed)
Pt eating breakfast at this time.  
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
## 2017-01-05 DIAGNOSIS — R443 Hallucinations, unspecified: Secondary | ICD-10-CM | POA: Diagnosis not present

## 2017-01-05 LAB — PROTIME-INR
INR: 1.01
PROTHROMBIN TIME: 13.3 s (ref 11.4–15.2)

## 2017-01-05 MED ORDER — MEMANTINE HCL 5 MG PO TABS: 5.0000 mg | ORAL_TABLET | Freq: Every day | ORAL | 0 refills | Status: DC

## 2017-01-05 MED ORDER — WARFARIN - PHYSICIAN DOSING INPATIENT
Freq: Every day | Status: DC
  Filled 2017-01-05 (×3): qty 1

## 2017-01-05 MED ORDER — OLANZAPINE 5 MG PO TABS: 1.2500 mg | ORAL_TABLET | Freq: Three times a day (TID) | ORAL | 2 refills | Status: DC

## 2017-01-05 MED ORDER — WARFARIN SODIUM 3 MG PO TABS
3.0000 mg | ORAL_TABLET | Freq: Every day | ORAL | Status: DC
  Administered 2017-01-05: 3 mg via ORAL
  Filled 2017-01-05 (×2): qty 1

## 2017-01-05 MED ORDER — WARFARIN SODIUM 2 MG PO TABS: 3.0000 mg | ORAL_TABLET | Freq: Every day | ORAL | 3 refills | Status: DC
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
## 2017-01-05 NOTE — Discharge Instructions (Signed)
Please continue her medicines. Please follow-up with psychiatry or you can see [facility] if need be. Please continue follow-up with her regular doctors well. Please return here for any further problems. We will see if we can get you some help at home for her. Stop the Seroquel. Changed to Zyprexa 1.25 mg 3 times a day and Namenda 5 mg in the morning.

## 2017-01-05 NOTE — ED Notes (Signed)
IVC/Pending placement or home health

## 2017-01-05 NOTE — ED Provider Notes (Signed)
-----------------------------------------   7:17 AM on 01/05/2017 -----------------------------------------   Blood pressure (!) 158/89, pulse 78, temperature 98.2 F (36.8 C), temperature source Oral, resp. rate 14, height 5\' 5"  (1.651 m), weight 67.1 kg (148 lb), SpO2 96 %.  The patient had no acute events since last update.  Calm and cooperative at this time.  Disposition is pending Psychiatry/Behavioral Medicine team recommendations.     Arnaldo NatalMalinda, Maykayla Highley F, MD 01/05/17 [phone]

## 2017-01-05 NOTE — ED Notes (Signed)

## 2017-01-05 NOTE — ED Notes (Signed)
Assisted the patient to the restroom. 

## 2017-01-05 NOTE — ED Notes (Signed)
Pt ambulatory up to bathroom with walker.

## 2017-01-05 NOTE — ED Notes (Signed)
Patient in shower 

## 2017-01-05 NOTE — ED Notes (Signed)
Breakfast warmed and given to patient  

## 2017-01-05 NOTE — ED Provider Notes (Signed)
Clinical Course as of Jan 06 2143  Mon Jan 05, 2017  1502 Initially the plan was for the patient to be discharged home with family, but reportedly this plan fell apart and the family is very dissatisfied with the plan that was put in place as well as with the care received thus far.  The patient will stay at this facility pending ongoing social work efforts and placement.  [CF]  1605 The pharmacist and patient's nurse brought to my attention that the patient's INR is 1 and she has not had an EKG.  The INR of 1 indicates that the patient is likely been nonadherent to her medication regimen.  I looked in care everywhere and see that as of 13 days ago on July 10 her INR was also subtherapeutic at 1.1.  On that day Dr. Darrold JunkerParaschos has a short telephone note indicating that she should take 4 mg twice a week and 2 mg on the rest of the days of the week.  I called and spoke with phone with Dr. Lady GaryFath [facility].  I discussed the case with him and he recommended that we start her on 3 mg daily.  This should provide appropriate anticoagulation if she is adherent to the regimen.  Her EKG is similar to the last one on record from May 2017 and does reflect atrial fibrillation, but Dr. Lady GaryFath assured me that we do not need to worry about bridging and that the complications from bridging her much more likely than an complications due to starting her back on the warfarin as discussed..  I have updated the warfarin order to reflect the 3 mg daily dose and I have ordered the warfarin as an outpatient prescription so it will be on her chart for when she is placed or discharged.   ED ECG REPORT I, Jedrick Hutcherson, the attending physician, personally viewed and interpreted this ECG.  Date: 01/05/2017 EKG Time: 15:53 Rate: 72 Rhythm: atrial fibrillation QRS Axis: LAD Intervals: a-fib w/ RBBB ST/T Wave abnormalities: Non-specific ST segment / T-wave changes, but no evidence of acute  ischemia. Narrative Interpretation: no evidence of acute ischemia, no significant change from 10/2015  [CF]    Clinical Course User Index [CF] Loleta RoseForbach, Richie Bonanno, MD       Loleta RoseForbach, Tykeisha Peer, MD 01/05/17 2144

## 2017-01-05 NOTE — Progress Notes (Signed)
CSW contacted by Elnita Maxwellheryl regarding discharge plan. Per Elnita Maxwellheryl, Patient's family reports that they are unable to care for Patient in the home and would not like home health and want facility placement.   CSW contacted Siri Coleheresa Richter [phone]). CSW explained that CSW would be more than happy to facilitate facility placement with the understanding as discussed on 07/21, Patient/family would have to pay privately. CSW reiterated that Patient with Medicare insurance and without a qualifying 3 midnight inpatient hospital stay or without a qualifying 3 midnight hospitalization in the past 30 days as required for Medicare SNF coverage. Patient also without Long Term Care Medicaid needed for Assisted Living Facility placement. Patient does have AARP supplemental insurance, however, that only covers Skilled Nursing Facility coinsurance or copayment once Medicare coverage ends at the facility.  Patient's daughter verbally frustrated with CSW and the fact that CSW is unable to place Patient with insurance coverage. CSW expressed understanding of daughter's frustration, however, explained that CSW has to follow the Medicare guidelines. CSW explained that while Patient has been in the hospital since Saturday, she has been in the Emergency Department and has not been admitted under inpatient or even observation status. Patient's daughter reports that she was told by Dr. Shaune PollackLord that Patient was admitted hence why Patient was given paper scrubs. CSW explained that for Patient safety, any Patient that comes in the Emergency Department for psychiatric concerns are required to wear maroon colored paper scrubs. Patient's daughter demanding to talk to Dr. Shaune PollackLord. CSW explained that Dr. Shaune PollackLord is not working today and that CSW is unable to contact her at this time. Patient's daughter's husband, Merita NortonJohn Richter, got on the phone and inquired about what would happen if they did not come and pick patient up. CSW explained the process of  contacting adult protective services for abandonment should the family refuse to take Patient home. Patient's husband demanded that CSW was not doing her job and noted that he was going to file a complaint. Patient's daughter and husband hung up on this CSW.   CSW has contacted CSW Director to update her of above conversation. CSW has also contacted RN Case Manager to inform her of Patient's family's inability to pay privately for a facility. CSW continues to follow.    Enos FlingAshley Laronn Devonshire, MSW, LCSW [facility] Clinical Social Worker [phone]

## 2017-01-05 NOTE — ED Notes (Signed)
Patient sleeping at this time.,breakfast placed in room ,will warm when patient awakes notified nurse Tina C.

## 2017-01-05 NOTE — Care Management Note (Signed)
Case Management Note  Patient Details  Name: Ashley Mullins MRN: [id number] Date of Birth: 04/02/1929  Subjective/Objective:       Spoke to Enos FlingAshley Jones CSW and she will re-contact the family for the patient and formulate a plan based on their needs.             Action/Plan:   Expected Discharge Date:                  Expected Discharge Plan:     In-House Referral:     Discharge planning Services     Post Acute Care Choice:    Choice offered to:     DME Arranged:    DME Agency:     HH Arranged:    HH Agency:     Status of Service:     If discussed at [employer] Length of Stay Meetings, dates discussed:    Additional Comments:  Berna BueCheryl Totiana Everson, RN 01/05/2017, 1:00 PM

## 2017-01-05 NOTE — ED Notes (Signed)
Patient in bathroom

## 2017-01-05 NOTE — Care Management Note (Signed)
Case Management Note  Patient Details  Name: Ashley Mullins MRN: [id number] Date of Birth: 01/08/1929  Subjective/Objective:       Calld daught4er Siri Coleheresa richter at [phone] to talk about [facility] options and preferences. Left a VM for her to callback. In the meantime I have asked the MD to enter the face to face information.             Action/Plan:   Expected Discharge Date:                  Expected Discharge Plan:     In-House Referral:     Discharge planning Services     Post Acute Care Choice:    Choice offered to:     DME Arranged:    DME Agency:     HH Arranged:    HH Agency:     Status of Service:     If discussed at [employer] Length of Stay Meetings, dates discussed:    Additional Comments:  Berna BueCheryl Beckett Maden, RN 01/05/2017, 12:20 PM

## 2017-01-05 NOTE — ED Notes (Signed)
Patient was given a hospital bed for more comfort

## 2017-01-05 NOTE — Care Management Note (Signed)
Case Management Note  Patient Details  Name: Ashley Mullins MRN: [id number] Date of Birth: 11/10/1928  Subjective/Objective:     Received callback from daughter and son in law, who say they have no intention of caring for this lady at home because they work all day. Have aasked the md to place CSW consult so they can clarify needs and options.               Action/Plan:   Expected Discharge Date:                  Expected Discharge Plan:     In-House Referral:     Discharge planning Services     Post Acute Care Choice:    Choice offered to:     DME Arranged:    DME Agency:     HH Arranged:    HH Agency:     Status of Service:     If discussed at [employer] Length of Stay Meetings, dates discussed:    Additional Comments:  Berna BueCheryl Daymien Goth, RN 01/05/2017, 12:27 PM

## 2017-01-05 NOTE — ED Notes (Signed)
Report to tina, rn.  

## 2017-01-06 DIAGNOSIS — R443 Hallucinations, unspecified: Secondary | ICD-10-CM | POA: Diagnosis not present

## 2017-01-06 LAB — PROTIME-INR
INR: 1.05
PROTHROMBIN TIME: 13.7 s (ref 11.4–15.2)

## 2017-01-06 NOTE — ED Notes (Signed)

## 2017-01-06 NOTE — ED Notes (Signed)

## 2017-01-06 NOTE — Care Management Note (Signed)
Case Management Note  Patient Details  Name: Pietra Zuluaga MRN: [id number] Date of Birth: Jun 23, 1928  Subjective/Objective:   Called patient daughter Tresa Moore at [phone]. I identified myself as the CM she spoke to yesterday and told her I was with Lorrine Kin the CSW, and asked how she was feeling. I expressed that I understand how overwhelmed she is feeling and asked if I could clarify anything first. Her first question was " how does my mother not qualify for placement when she has been there for 3 days?" I explained to the daughter that Medicare looks for 3 inpatient nights, when a patient has received care that could not be given anywhere but in a hospital. The patient has not received any care to date that could not have been given in an outpatient setting. She was assessed by a SOC, who deemed her fit to be cared for outpatient. At the time the MD in the ER disagreed, thinking that she should be placed. The daughter adamantly agrees and wanted to know how that doctor, as well as the psychiatrist who recommended placement a year and a half ago could be wrong. I responded by saying that although I do not have knowledge of what happened at that time, it is possible that she met criteria then for placement, but does not now per the Specialist on call(SOC) I explained to the family member that although the ER MD said the patient should be placed, that is a recommendation. The [facility] deemed the patient okay for outpatient services, which means they would have to pay out of pocket.  I then explained to her that [facility] services would mean that the PT, RN and CSW would see the patient based on their schedules for caring for their list of patients, and so this does not represent a block of time that is in one chunk.They would more than likely be spread out through the day. The daughter is not interested  In these resources. I also talked to her about private duty out of pocket care, and how that  could work if she wanted someone to be with the patient  While she is a t work. She again raised the issue of the fact that the patient was housed in an independent living environment prior to being here for this visit. I have explained that we understand she should not go back to that, but that we are trying to give resources to help the transition to being with family.    The daughter interrupted my explanation of personal care services, and when I asked here if there was a special agency she wanted info on , she interrupted saying that we should get the patient dressed and she will come get her. When I tried to see if there is printed material I can provide her with, she hung up on me.           Action/Plan:   Expected Discharge Date:                  Expected Discharge Plan:     In-House Referral:     Discharge planning Services     Post Acute Care Choice:    Choice offered to:     DME Arranged:    DME Agency:     HH Arranged:    HH Agency:     Status of Service:     If discussed at [employer] of Stay Meetings, dates discussed:  Additional Comments:  Beau Fanny, RN 01/06/2017, 9:34 AM

## 2017-01-06 NOTE — Progress Notes (Addendum)
RN Case Manager alongside this CSW contacted Patient's daughter, Siri Coleheresa Richter [phone]) to further discuss safe discharge plan for Patient. RN Case Manager introduced self and introduced this CSW as being present for the phone call. RN Case Manager validated Theresa's feelings of frustration and feelings of being overwhelmed. Aggie Cosierheresa inquired as to why her mother does not qualify for placement as Patient has been in the emergency department for three days. RN Case Manager explained the different classifications of hospitalization according to Medicare to include Emergency Department (Outpatient status), Inpatient status, and Observation Status (Outpatient Status). Unfortunately, the Patient has remained in the Emergency Department and does not meet the Medicare guidelines for 3 midnight inpatient stay. RN Case Manager also explained the process of [facility]) who deemed Patient appropriate for discharge with outpatient follow-up. RN Case Manager agreed with Patient's daughter the time [facility], the ED Physician did not agree with the [facility] and kept the Patient here to be placed, however, the EDP may not have been aware of the Medicare guidelines for facility placement. Aggie Cosierheresa proceeded to report that when her mother was here in March, the Psychiatrist [place]. RN Case Manager confirmed with CSW that [place] [facility] and not a nursing facility. Aggie Cosierheresa reported not understanding how she qualified in March and does not qualify now. RN Case Manager explained that Patient is evaluated every day by Psychiatrist for appropriateness for inpatient psychiatric care and while she may have qualified for inpatient psychiatric care in March, at the present, [facility] that Patient is psychiatrically cleared for outpatient follow up.   RN Case Manager attempted  to discuss private pay in home care for Patient, however, Aggie Cosierheresa cut her off and stated that she thought home health services would be paid for by Patient's insurance. RN Case Manager reassured Aggie Cosierheresa that home health would be covered by [employer] insurance, however, unlike with private pay in home care, Home Health services are not everyday and they do not provide 24 hr care. Aggie Cosierheresa then reported that her mother under no circumstance could come and live with her and her husband. She reported that she and her husband work 40 hours a week and are unable to be with Patient throughout the day. Aggie Cosierheresa reported that prior to this current hospital visit, her [facility]. Aggie Cosierheresa reported that she is not interested in home health services at this time. RN Case Manager attempted again to see if Aggie Cosierheresa would be willing to consider other in home options, however, Aggie Cosierheresa interrupted stating, "get my mother dressed and I will come and get her". RN Case Manager inquired about any additional printed information she could provide Aggie Cosierheresa, however, Aggie Cosierheresa hung up the phone.   CSW provided RN Case Manager with written information on Medicare guidelines and Medicare coverage from the www.medicare.gov website as well as information for applying for special assistance Medicaid for long term care to place with Patient's discharge paperwork. Bedside RN notified that daughter is in route to pick Patient up. CSW signing off at this time. Please contact should new need(s) arise.    Enos FlingAshley Orvetta Danielski, MSW, LCSW [facility] Clinical Social Worker [phone]

## 2017-01-06 NOTE — ED Notes (Signed)
Given meal tray.

## 2017-01-06 NOTE — ED Notes (Signed)
Patients family here to take patient home for D/C. Patients son-in-law told this RN they will not be signing any D/C papers since they do not want her discharged from hospital. Family has spoke with social worker Elnita MaxwellCheryl today about d/c.

## 2017-01-06 NOTE — ED Provider Notes (Signed)
-----------------------------------------   6:38 AM on 01/06/2017 -----------------------------------------   Blood pressure (!) 172/74, pulse 82, temperature 98.2 F (36.8 C), temperature source Oral, resp. rate 18, height 5\' 5"  (1.651 m), weight 67.1 kg (148 lb), SpO2 99 %.  The patient had no acute events since last update.  Calm and cooperative at this time.  Disposition is pending CSW team recommendations.     Irean HongSung, Caylah Plouff J, MD 01/06/17 [phone]

## 2017-01-06 NOTE — ED Provider Notes (Signed)
Patient has been observed in the ER for nearly 3 days. Patient has been evaluated by emesis E psychiatrist and is not felt to meet inpatient criteria. She's not demonstrate any significant agitation. Medication changes and follow up for Coumadin has been arranged as noted in Dr. Scharlene CornForbach's note.  Attentive and made to arrange for home health therapy and further outpatient workup.  Patient's daughter is here to bring patient home. She remains in no acute distress at this time.   Ashley Eddyobinson, Ashley Calvin, MD 01/06/17 1032

## 2017-01-06 NOTE — ED Notes (Signed)
Pt ambulated to bathroom with my assistance and use of walker.

## 2017-03-26 ENCOUNTER — Emergency Department
Admission: EM | Admit: 2017-03-26 | Discharge: 2017-03-26 | Disposition: A | Discharge status: Home / Self Care | Payer: Medicare Other | Attending: Emergency Medicine | Admitting: Emergency Medicine

## 2017-03-26 ENCOUNTER — Encounter: Payer: Self-pay | Admitting: Emergency Medicine

## 2017-03-26 DIAGNOSIS — Z7982 Long term (current) use of aspirin: Secondary | ICD-10-CM | POA: Diagnosis not present

## 2017-03-26 DIAGNOSIS — Z950 Presence of cardiac pacemaker: Secondary | ICD-10-CM | POA: Insufficient documentation

## 2017-03-26 DIAGNOSIS — F0391 Unspecified dementia with behavioral disturbance: Secondary | ICD-10-CM

## 2017-03-26 DIAGNOSIS — Z7901 Long term (current) use of anticoagulants: Secondary | ICD-10-CM | POA: Insufficient documentation

## 2017-03-26 DIAGNOSIS — F0390 Unspecified dementia without behavioral disturbance: Secondary | ICD-10-CM | POA: Insufficient documentation

## 2017-03-26 DIAGNOSIS — Z87891 Personal history of nicotine dependence: Secondary | ICD-10-CM | POA: Diagnosis not present

## 2017-03-26 DIAGNOSIS — R441 Visual hallucinations: Secondary | ICD-10-CM | POA: Diagnosis not present

## 2017-03-26 DIAGNOSIS — F0392 Unspecified dementia, unspecified severity, with psychotic disturbance: Secondary | ICD-10-CM | POA: Diagnosis present

## 2017-03-26 DIAGNOSIS — Z79899 Other long term (current) drug therapy: Secondary | ICD-10-CM | POA: Diagnosis not present

## 2017-03-26 DIAGNOSIS — G4759 Other parasomnia: Secondary | ICD-10-CM

## 2017-03-26 DIAGNOSIS — Z046 Encounter for general psychiatric examination, requested by authority: Secondary | ICD-10-CM | POA: Diagnosis present

## 2017-03-26 LAB — COMPREHENSIVE METABOLIC PANEL
ALT: 73 U/L — ABNORMAL HIGH (ref 14–54)
AST: 82 U/L — AB (ref 15–41)
Albumin: 3.4 g/dL — ABNORMAL LOW (ref 3.5–5.0)
Alkaline Phosphatase: 117 U/L (ref 38–126)
Anion gap: 11 (ref 5–15)
BUN: 18 mg/dL (ref 6–20)
CALCIUM: 8.7 mg/dL — AB (ref 8.9–10.3)
CO2: 29 mmol/L (ref 22–32)
Chloride: 101 mmol/L (ref 101–111)
Creatinine, Ser: 1.19 mg/dL — ABNORMAL HIGH (ref 0.44–1.00)
GFR calc Af Amer: 47 mL/min — ABNORMAL LOW (ref >60)
GFR, EST NON AFRICAN AMERICAN: 40 mL/min — AB (ref >60)
Glucose, Bld: 124 mg/dL — ABNORMAL HIGH (ref 65–99)
POTASSIUM: 3.2 mmol/L — AB (ref 3.5–5.1)
Sodium: 141 mmol/L (ref 135–145)
TOTAL PROTEIN: 7.3 g/dL (ref 6.5–8.1)
Total Bilirubin: 0.9 mg/dL (ref 0.3–1.2)

## 2017-03-26 LAB — URINALYSIS, COMPLETE (UACMP) WITH MICROSCOPIC
Bacteria, UA: NONE SEEN
Bilirubin Urine: NEGATIVE
GLUCOSE, UA: NEGATIVE mg/dL
Hgb urine dipstick: NEGATIVE
Ketones, ur: NEGATIVE mg/dL
NITRITE: NEGATIVE
PH: 6 (ref 5.0–8.0)
Protein, ur: NEGATIVE mg/dL
SPECIFIC GRAVITY, URINE: 1.013 (ref 1.005–1.030)

## 2017-03-26 LAB — CBC WITH DIFFERENTIAL/PLATELET
BASOS PCT: 1 %
Basophils Absolute: 0 10*3/uL (ref 0–0.1)
EOS ABS: 0 10*3/uL (ref 0–0.7)
EOS PCT: 1 %
HCT: 34 % — ABNORMAL LOW (ref 35.0–47.0)
HEMOGLOBIN: 11.7 g/dL — AB (ref 12.0–16.0)
Lymphocytes Relative: 18 %
Lymphs Abs: 1 10*3/uL (ref 1.0–3.6)
MCH: 29.7 pg (ref 26.0–34.0)
MCHC: 34.4 g/dL (ref 32.0–36.0)
MCV: 86.2 fL (ref 80.0–100.0)
MONO ABS: 0.6 10*3/uL (ref 0.2–0.9)
MONOS PCT: 10 %
NEUTROS PCT: 70 %
Neutro Abs: 4 10*3/uL (ref 1.4–6.5)
PLATELETS: 255 10*3/uL (ref 150–440)
RBC: 3.95 MIL/uL (ref 3.80–5.20)
RDW: 16 % — AB (ref 11.5–14.5)
WBC: 5.6 10*3/uL (ref 3.6–11.0)

## 2017-03-26 LAB — ETHANOL

## 2017-03-26 LAB — PROTIME-INR
INR: 1.15
Prothrombin Time: 14.6 seconds (ref 11.4–15.2)

## 2017-03-26 LAB — SALICYLATE LEVEL: Salicylate Lvl: <7 mg/dL (ref 2.8–30.0)

## 2017-03-26 LAB — ACETAMINOPHEN LEVEL: Acetaminophen (Tylenol), Serum: <10 ug/mL — ABNORMAL LOW (ref 10–30)

## 2017-03-26 MED ORDER — QUETIAPINE FUMARATE 50 MG PO TABS: 50.0000 mg | ORAL_TABLET | Freq: Every day | ORAL | 0 refills | Status: DC

## 2017-03-26 MED ORDER — QUETIAPINE FUMARATE 50 MG PO TABS: 25.0000 mg | ORAL_TABLET | Freq: Every day | ORAL | 0 refills | Status: DC

## 2017-03-26 NOTE — ED Notes (Signed)
Dr. Clapacs to bedside at this time.  
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
## 2017-03-26 NOTE — ED Notes (Signed)
Pt ambulatory to toilet with standby assist. 
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
## 2017-03-27 LAB — URINE CULTURE

## 2017-07-30 ENCOUNTER — Ambulatory Visit: Payer: Medicare Other | Admitting: Podiatry

## 2017-09-14 ENCOUNTER — Emergency Department
Admission: EM | Admit: 2017-09-14 | Discharge: 2017-09-14 | Disposition: A | Discharge status: Home / Self Care | Payer: Medicare Other | Attending: Emergency Medicine | Admitting: Emergency Medicine

## 2017-09-14 ENCOUNTER — Other Ambulatory Visit: Payer: Self-pay

## 2017-09-14 ENCOUNTER — Emergency Department: Payer: Medicare Other

## 2017-09-14 DIAGNOSIS — Z950 Presence of cardiac pacemaker: Secondary | ICD-10-CM | POA: Diagnosis not present

## 2017-09-14 DIAGNOSIS — R0602 Shortness of breath: Secondary | ICD-10-CM | POA: Diagnosis not present

## 2017-09-14 DIAGNOSIS — Z87891 Personal history of nicotine dependence: Secondary | ICD-10-CM | POA: Diagnosis not present

## 2017-09-14 DIAGNOSIS — F0390 Unspecified dementia without behavioral disturbance: Secondary | ICD-10-CM | POA: Diagnosis not present

## 2017-09-14 DIAGNOSIS — Z7982 Long term (current) use of aspirin: Secondary | ICD-10-CM | POA: Insufficient documentation

## 2017-09-14 DIAGNOSIS — Z79899 Other long term (current) drug therapy: Secondary | ICD-10-CM | POA: Diagnosis not present

## 2017-09-14 DIAGNOSIS — R531 Weakness: Secondary | ICD-10-CM | POA: Diagnosis present

## 2017-09-14 LAB — URINALYSIS, COMPLETE (UACMP) WITH MICROSCOPIC
BILIRUBIN URINE: NEGATIVE
Glucose, UA: NEGATIVE mg/dL
HGB URINE DIPSTICK: NEGATIVE
Ketones, ur: NEGATIVE mg/dL
Leukocytes, UA: NEGATIVE
Nitrite: NEGATIVE
Protein, ur: NEGATIVE mg/dL
SPECIFIC GRAVITY, URINE: 1.005 (ref 1.005–1.030)
pH: 6 (ref 5.0–8.0)

## 2017-09-14 LAB — INFLUENZA PANEL BY PCR (TYPE A & B)
INFLAPCR: NEGATIVE
Influenza B By PCR: NEGATIVE

## 2017-09-14 LAB — CBC WITH DIFFERENTIAL/PLATELET
Basophils Absolute: 0 10*3/uL (ref 0–0.1)
Basophils Relative: 0 %
EOS ABS: 0.2 10*3/uL (ref 0–0.7)
Eosinophils Relative: 4 %
HCT: 37.4 % (ref 35.0–47.0)
HEMOGLOBIN: 12.4 g/dL (ref 12.0–16.0)
Lymphocytes Relative: 34 %
Lymphs Abs: 2 10*3/uL (ref 1.0–3.6)
MCH: 28.9 pg (ref 26.0–34.0)
MCHC: 33 g/dL (ref 32.0–36.0)
MCV: 87.5 fL (ref 80.0–100.0)
Monocytes Absolute: 0.9 10*3/uL (ref 0.2–0.9)
Monocytes Relative: 16 %
NEUTROS PCT: 46 %
Neutro Abs: 2.7 10*3/uL (ref 1.4–6.5)
Platelets: 224 10*3/uL (ref 150–440)
RBC: 4.28 MIL/uL (ref 3.80–5.20)
RDW: 16.2 % — ABNORMAL HIGH (ref 11.5–14.5)
WBC: 5.8 10*3/uL (ref 3.6–11.0)

## 2017-09-14 LAB — COMPREHENSIVE METABOLIC PANEL
ALT: 18 U/L (ref 14–54)
ANION GAP: 8 (ref 5–15)
AST: 25 U/L (ref 15–41)
Albumin: 3.3 g/dL — ABNORMAL LOW (ref 3.5–5.0)
Alkaline Phosphatase: 153 U/L — ABNORMAL HIGH (ref 38–126)
BILIRUBIN TOTAL: 0.8 mg/dL (ref 0.3–1.2)
BUN: 21 mg/dL — ABNORMAL HIGH (ref 6–20)
CO2: 31 mmol/L (ref 22–32)
Calcium: 8.6 mg/dL — ABNORMAL LOW (ref 8.9–10.3)
Chloride: 100 mmol/L — ABNORMAL LOW (ref 101–111)
Creatinine, Ser: 1.39 mg/dL — ABNORMAL HIGH (ref 0.44–1.00)
GFR calc Af Amer: 39 mL/min — ABNORMAL LOW (ref >60)
GFR, EST NON AFRICAN AMERICAN: 33 mL/min — AB (ref >60)
Glucose, Bld: 118 mg/dL — ABNORMAL HIGH (ref 65–99)
POTASSIUM: 3.8 mmol/L (ref 3.5–5.1)
Sodium: 139 mmol/L (ref 135–145)
TOTAL PROTEIN: 7.4 g/dL (ref 6.5–8.1)

## 2017-09-14 LAB — TROPONIN I: Troponin I: <0.03 ng/mL (ref <0.03)

## 2017-09-14 LAB — BRAIN NATRIURETIC PEPTIDE: B NATRIURETIC PEPTIDE 5: 192 pg/mL — AB (ref 0.0–100.0)

## 2017-09-14 NOTE — Discharge Instructions (Signed)
It was a pleasure to take care of you today, and thank you for coming to our emergency department.  If you have any questions or concerns before leaving please ask the nurse to grab me and I'm more than happy to go through your aftercare instructions again.  If you were prescribed any opioid pain medication today such as Norco, Vicodin, Percocet, morphine, hydrocodone, or oxycodone please make sure you do not drive when you are taking this medication as it can alter your ability to drive safely.  If you have any concerns once you are home that you are not improving or are in fact getting worse before you can make it to your follow-up appointment, please do not hesitate to call 911 and come back for further evaluation.  Merrily BrittleNeil Isbella Arline, MD  Results for orders placed or performed during the hospital encounter of 09/14/17  Comprehensive metabolic panel  Result Value Ref Range   Sodium 139 135 - 145 mmol/L   Potassium 3.8 3.5 - 5.1 mmol/L   Chloride 100 (L) 101 - 111 mmol/L   CO2 31 22 - 32 mmol/L   Glucose, Bld 118 (H) 65 - 99 mg/dL   BUN 21 (H) 6 - 20 mg/dL   Creatinine, Ser 1.611.39 (H) 0.44 - 1.00 mg/dL   Calcium 8.6 (L) 8.9 - 10.3 mg/dL   Total Protein 7.4 6.5 - 8.1 g/dL   Albumin 3.3 (L) 3.5 - 5.0 g/dL   AST 25 15 - 41 U/L   ALT 18 14 - 54 U/L   Alkaline Phosphatase 153 (H) 38 - 126 U/L   Total Bilirubin 0.8 0.3 - 1.2 mg/dL   GFR calc non Af Amer 33 (L) >60 mL/min   GFR calc Af Amer 39 (L) >60 mL/min   Anion gap 8 5 - 15  Brain natriuretic peptide  Result Value Ref Range   B Natriuretic Peptide 192.0 (H) 0.0 - 100.0 pg/mL  Troponin I  Result Value Ref Range   Troponin I <0.03 <0.03 ng/mL  CBC with Differential  Result Value Ref Range   WBC 5.8 3.6 - 11.0 K/uL   RBC 4.28 3.80 - 5.20 MIL/uL   Hemoglobin 12.4 12.0 - 16.0 g/dL   HCT 09.637.4 04.535.0 - 40.947.0 %   MCV 87.5 80.0 - 100.0 fL   MCH 28.9 26.0 - 34.0 pg   MCHC 33.0 32.0 - 36.0 g/dL   RDW 81.116.2 (H) 91.411.5 - 78.214.5 %   Platelets 224 150  - 440 K/uL   Neutrophils Relative % 46 %   Neutro Abs 2.7 1.4 - 6.5 K/uL   Lymphocytes Relative 34 %   Lymphs Abs 2.0 1.0 - 3.6 K/uL   Monocytes Relative 16 %   Monocytes Absolute 0.9 0.2 - 0.9 K/uL   Eosinophils Relative 4 %   Eosinophils Absolute 0.2 0 - 0.7 K/uL   Basophils Relative 0 %   Basophils Absolute 0.0 0 - 0.1 K/uL  Urinalysis, Complete w Microscopic  Result Value Ref Range   Color, Urine YELLOW (A) YELLOW   APPearance CLEAR (A) CLEAR   Specific Gravity, Urine 1.005 1.005 - 1.030   pH 6.0 5.0 - 8.0   Glucose, UA NEGATIVE NEGATIVE mg/dL   Hgb urine dipstick NEGATIVE NEGATIVE   Bilirubin Urine NEGATIVE NEGATIVE   Ketones, ur NEGATIVE NEGATIVE mg/dL   Protein, ur NEGATIVE NEGATIVE mg/dL   Nitrite NEGATIVE NEGATIVE   Leukocytes, UA NEGATIVE NEGATIVE   RBC / HPF 0-5 0 - 5 RBC/hpf  WBC, UA 0-5 0 - 5 WBC/hpf   Bacteria, UA RARE (A) NONE SEEN   Squamous Epithelial / LPF 0-5 (A) NONE SEEN  Influenza panel by PCR (type A & B)  Result Value Ref Range   Influenza A By PCR NEGATIVE NEGATIVE   Influenza B By PCR NEGATIVE NEGATIVE   Dg Chest Port 1 View  Result Date: 09/14/2017 CLINICAL DATA:  Acute shortness of breath. EXAM: PORTABLE CHEST 1 VIEW COMPARISON:  Chest radiograph Oct 28, 2015 FINDINGS: Cardiac silhouette is mildly enlarged. Coarse mitral annular calcifications. Calcified aortic arch. Mild chronic interstitial changes without pleural effusion or focal consolidation. No pneumothorax. Dual lumen LEFT chest Port-A-Cath. Osteopenia. IMPRESSION: Stable cardiomegaly.  No acute pulmonary process. Aortic Atherosclerosis (ICD10-I70.0). Electronically Signed   By: Awilda Metro M.D.   On: 09/14/2017 04:33

## 2017-09-14 NOTE — ED Provider Notes (Signed)
[facility] Emergency Department Provider Note  ____________________________________________   First MD Initiated Contact with Patient 09/14/17 [phone]     (approximate)  I have reviewed the triage vital signs and the nursing notes.   HISTORY  Chief Complaint Weakness   HPI Ashley Mullins is a 84 y.o. female who comes to the emergency department via EMS with shortness of breath that began today.  She has had an upper respiratory tract infection recently with a dry cough.  Tonight she felt more short of breath and wanted to be evaluated.  No fevers or chills.  Symptoms are nonexertional.  No chest pain.  Nothing seems to make the symptoms better or worse.  Past Medical History:  Diagnosis Date  . A-fib ([facility])   . Dementia   . Irregular heart beat   . Pacemaker     Patient Active Problem List   Diagnosis Date Noted  . Dementia with psychosis 08/29/2016    Past Surgical History:  Procedure Laterality Date  . BACK SURGERY    . PACEMAKER INSERTION    . VAGINAL HYSTERECTOMY      Prior to Admission medications   Medication Sig Start Date End Date Taking? Authorizing Provider  amLODipine (NORVASC) 5 MG tablet Take 5 mg by mouth daily. 08/05/16   [provider]  aspirin EC 81 MG tablet Take 81 mg by mouth daily.    [provider]  atorvastatin (LIPITOR) 80 MG tablet Take 80 mg by mouth daily. 08/28/16   [provider]  calcium carbonate (CALCIUM 600) 1500 (600 Ca) MG TABS tablet Take 1,500 mg by mouth 2 (two) times daily with a meal.    [provider]  cholecalciferol (VITAMIN D) 1000 units tablet Take 1,000 Units by mouth daily.    [provider]  furosemide (LASIX) 40 MG tablet Take 40 mg by mouth daily.    [provider]  memantine (NAMENDA) 5 MG tablet Take 1 tablet (5 mg total) by mouth daily. 01/05/17   Arnaldo NatalMalinda, Paul F, MD  metoprolol (LOPRESSOR) 50 MG tablet Take 50 mg by mouth 2 (two)  times daily.  07/15/16   [provider]  nitroGLYCERIN (NITROSTAT) 0.4 MG SL tablet Place 0.4 mg under the tongue every 5 (five) minutes as needed for chest pain.    [provider]  OLANZapine (ZYPREXA) 5 MG tablet Take 0.5 tablets (2.5 mg total) by mouth 3 (three) times daily with meals. 01/05/17 01/05/18  Arnaldo NatalMalinda, Paul F, MD  potassium chloride (K-DUR,KLOR-CON) 10 MEQ tablet Take 10 mEq by mouth daily.    [provider]  QUEtiapine (SEROQUEL) 50 MG tablet Take 1 tablet (50 mg total) by mouth at bedtime. 03/26/17   Sharman CheekStafford, Phillip, MD  vitamin C (ASCORBIC ACID) 500 MG tablet Take 500 mg by mouth daily.    [provider]  warfarin (COUMADIN) 2 MG tablet Take 1.5 tablets (3 mg total) by mouth daily. 01/05/17   Loleta RoseForbach, Cory, MD    Allergies Chicken allergy; Codeine; and Other  No family history on file.  Social History Social History   Tobacco Use  . Smoking status: Former Games developermoker  . Smokeless tobacco: Never Used  Substance Use Topics  . Alcohol use: No  . Drug use: No    Review of Systems Constitutional: No fever/chills Eyes: No visual changes. ENT: No sore throat. Cardiovascular: Denies chest pain. Respiratory: Positive for shortness of breath. Gastrointestinal: No abdominal pain.  No nausea, no vomiting.  No diarrhea.  No constipation. Genitourinary: Negative for dysuria. Musculoskeletal: Negative for back pain. Skin: Negative for rash. Neurological: Negative for headaches, focal weakness or numbness.   ____________________________________________   PHYSICAL EXAM:  VITAL SIGNS: ED Triage Vitals  Enc Vitals Group     BP      Pulse      Resp      Temp      Temp src      SpO2      Weight      Height      Head Circumference      Peak Flow      Pain Score      Pain Loc      Pain Edu?      Excl. in GC?     Constitutional: Pleasant cooperative severe dementia no distress Eyes: PERRL EOMI. Head: Atraumatic. Nose: No  congestion/rhinnorhea. Mouth/Throat: No trismus Neck: No stridor.   Cardiovascular: Normal rate, regular rhythm. Grossly normal heart sounds.  Good peripheral circulation. Respiratory: Normal respiratory effort.  No retractions. Lungs CTAB and moving good air Gastrointestinal: Soft nontender Musculoskeletal: No lower extremity edema   Neurologic: . No gross focal neurologic deficits are appreciated. Skin:  Skin is warm, dry and intact. No rash noted. Psychiatric: Severe dementia    ____________________________________________   DIFFERENTIAL includes but not limited to  Bronchitis, pneumonia, pneumothorax, pulmonary embolism, influenza ____________________________________________   LABS (all labs ordered are listed, but only abnormal results are displayed)  Labs Reviewed  COMPREHENSIVE METABOLIC PANEL - Abnormal; Notable for the following components:      Result Value   Chloride 100 (*)    Glucose, Bld 118 (*)    BUN 21 (*)    Creatinine, Ser 1.39 (*)    Calcium 8.6 (*)    Albumin 3.3 (*)    Alkaline Phosphatase 153 (*)    GFR calc non Af Amer 33 (*)    GFR calc Af Amer 39 (*)    All other components within normal limits  BRAIN NATRIURETIC PEPTIDE - Abnormal; Notable for the following components:   B Natriuretic Peptide 192.0 (*)    All other components within normal limits  CBC WITH DIFFERENTIAL/PLATELET - Abnormal; Notable for the following components:   RDW 16.2 (*)    All other components within normal limits  URINALYSIS, COMPLETE (UACMP) WITH MICROSCOPIC - Abnormal; Notable for the following components:   Color, Urine YELLOW (*)    APPearance CLEAR (*)    Bacteria, UA RARE (*)    Squamous Epithelial / LPF 0-5 (*)    All other components within normal limits  TROPONIN I  INFLUENZA PANEL BY PCR (TYPE A & B)    Lab work reviewed by me influenza negative no acute disease noted __________________________________________  EKG  ED ECG REPORT I, Merrily Brittle,  the attending physician, personally viewed and interpreted this ECG.  Date: 09/14/2017 EKG Time:  Rate: 72 Rhythm: Ventricular paced rhythm QRS Axis: Left axis Intervals: normal ST/T Wave abnormalities: normal Narrative Interpretation: no evidence of acute ischemia  ____________________________________________  RADIOLOGY Chest x-ray reviewed by me with no acute disease ____________________________________________   PROCEDURES  Procedure(s) performed: no  Procedures  Critical Care performed: no  Observation: no ____________________________________________   INITIAL IMPRESSION / ASSESSMENT AND PLAN / ED COURSE  Pertinent labs & imaging results that were available during my care of the patient were reviewed by me and considered in my medical decision making (see chart for details).  The  patient arrives relatively well-appearing with an unremarkable exam.  Lungs are clear chest x-ray is reassuring and lab work is unremarkable.  The patient feels improved and would like to go home.  She is discharged home in improved condition verbalizes understanding and agreement with plan.      ____________________________________________   FINAL CLINICAL IMPRESSION(S) / ED DIAGNOSES  Final diagnoses:  Shortness of breath      NEW MEDICATIONS STARTED DURING THIS VISIT:  Discharge Medication List as of 09/14/2017  5:37 AM       Note:  This document was prepared using Dragon voice recognition software and may include unintentional dictation errors.     Merrily Brittle, MD 09/16/17 1538

## 2017-09-14 NOTE — ED Notes (Signed)
Pt assisted to commode with tech.

## 2017-09-14 NOTE — ED Triage Notes (Signed)
Pt from [facility] with generalized weakness for "several days". Pt states recently had an upper respiratory infection. Pt denies diarrhea, vomiting, nausea, pain. Pt states she tried to get out of bed x2 tonight "but I was so weak I fell across the bed".

## 2018-02-07 ENCOUNTER — Other Ambulatory Visit: Payer: Self-pay

## 2018-02-07 ENCOUNTER — Emergency Department: Payer: Medicare Other

## 2018-02-07 ENCOUNTER — Encounter: Payer: Self-pay | Admitting: Emergency Medicine

## 2018-02-07 ENCOUNTER — Inpatient Hospital Stay
Admission: EM | Admit: 2018-02-07 | Discharge: 2018-02-10 | DRG: 481 | Disposition: A | Discharge status: Skilled Nursing Facility | Payer: Medicare Other | Attending: Internal Medicine | Admitting: Internal Medicine

## 2018-02-07 DIAGNOSIS — Z90710 Acquired absence of both cervix and uterus: Secondary | ICD-10-CM | POA: Diagnosis not present

## 2018-02-07 DIAGNOSIS — Z91018 Allergy to other foods: Secondary | ICD-10-CM | POA: Diagnosis not present

## 2018-02-07 DIAGNOSIS — D62 Acute posthemorrhagic anemia: Secondary | ICD-10-CM | POA: Diagnosis not present

## 2018-02-07 DIAGNOSIS — E860 Dehydration: Secondary | ICD-10-CM | POA: Diagnosis present

## 2018-02-07 DIAGNOSIS — F329 Major depressive disorder, single episode, unspecified: Secondary | ICD-10-CM | POA: Diagnosis present

## 2018-02-07 DIAGNOSIS — I252 Old myocardial infarction: Secondary | ICD-10-CM

## 2018-02-07 DIAGNOSIS — Z7901 Long term (current) use of anticoagulants: Secondary | ICD-10-CM | POA: Diagnosis not present

## 2018-02-07 DIAGNOSIS — S72001A Fracture of unspecified part of neck of right femur, initial encounter for closed fracture: Secondary | ICD-10-CM | POA: Diagnosis present

## 2018-02-07 DIAGNOSIS — I129 Hypertensive chronic kidney disease with stage 1 through stage 4 chronic kidney disease, or unspecified chronic kidney disease: Secondary | ICD-10-CM | POA: Diagnosis present

## 2018-02-07 DIAGNOSIS — W010XXA Fall on same level from slipping, tripping and stumbling without subsequent striking against object, initial encounter: Secondary | ICD-10-CM | POA: Diagnosis present

## 2018-02-07 DIAGNOSIS — I2510 Atherosclerotic heart disease of native coronary artery without angina pectoris: Secondary | ICD-10-CM | POA: Diagnosis present

## 2018-02-07 DIAGNOSIS — Z87891 Personal history of nicotine dependence: Secondary | ICD-10-CM

## 2018-02-07 DIAGNOSIS — Y92099 Unspecified place in other non-institutional residence as the place of occurrence of the external cause: Secondary | ICD-10-CM

## 2018-02-07 DIAGNOSIS — R402412 Glasgow coma scale score 13-15, at arrival to emergency department: Secondary | ICD-10-CM | POA: Diagnosis present

## 2018-02-07 DIAGNOSIS — I081 Rheumatic disorders of both mitral and tricuspid valves: Secondary | ICD-10-CM | POA: Diagnosis present

## 2018-02-07 DIAGNOSIS — F0390 Unspecified dementia without behavioral disturbance: Secondary | ICD-10-CM | POA: Diagnosis present

## 2018-02-07 DIAGNOSIS — I480 Paroxysmal atrial fibrillation: Secondary | ICD-10-CM | POA: Diagnosis present

## 2018-02-07 DIAGNOSIS — E785 Hyperlipidemia, unspecified: Secondary | ICD-10-CM | POA: Diagnosis present

## 2018-02-07 DIAGNOSIS — W19XXXA Unspecified fall, initial encounter: Secondary | ICD-10-CM

## 2018-02-07 DIAGNOSIS — N179 Acute kidney failure, unspecified: Secondary | ICD-10-CM | POA: Diagnosis present

## 2018-02-07 DIAGNOSIS — Z950 Presence of cardiac pacemaker: Secondary | ICD-10-CM | POA: Diagnosis not present

## 2018-02-07 DIAGNOSIS — I4510 Unspecified right bundle-branch block: Secondary | ICD-10-CM | POA: Diagnosis present

## 2018-02-07 DIAGNOSIS — S72009A Fracture of unspecified part of neck of unspecified femur, initial encounter for closed fracture: Secondary | ICD-10-CM

## 2018-02-07 DIAGNOSIS — Z66 Do not resuscitate: Secondary | ICD-10-CM | POA: Diagnosis present

## 2018-02-07 DIAGNOSIS — Z79899 Other long term (current) drug therapy: Secondary | ICD-10-CM | POA: Diagnosis not present

## 2018-02-07 DIAGNOSIS — Z885 Allergy status to narcotic agent status: Secondary | ICD-10-CM | POA: Diagnosis not present

## 2018-02-07 DIAGNOSIS — S72141A Displaced intertrochanteric fracture of right femur, initial encounter for closed fracture: Principal | ICD-10-CM | POA: Diagnosis present

## 2018-02-07 DIAGNOSIS — N183 Chronic kidney disease, stage 3 (moderate): Secondary | ICD-10-CM | POA: Diagnosis present

## 2018-02-07 DIAGNOSIS — Z419 Encounter for procedure for purposes other than remedying health state, unspecified: Secondary | ICD-10-CM

## 2018-02-07 HISTORY — DX: Atherosclerotic heart disease of native coronary artery without angina pectoris: I25.10

## 2018-02-07 HISTORY — DX: Depression, unspecified: F32.A

## 2018-02-07 HISTORY — DX: Hyperlipidemia, unspecified: E78.5

## 2018-02-07 HISTORY — DX: Major depressive disorder, single episode, unspecified: F32.9

## 2018-02-07 LAB — BASIC METABOLIC PANEL
ANION GAP: 7 (ref 5–15)
BUN: 33 mg/dL — ABNORMAL HIGH (ref 8–23)
CHLORIDE: 101 mmol/L (ref 98–111)
CO2: 27 mmol/L (ref 22–32)
Calcium: 8.2 mg/dL — ABNORMAL LOW (ref 8.9–10.3)
Creatinine, Ser: 1.51 mg/dL — ABNORMAL HIGH (ref 0.44–1.00)
GFR calc non Af Amer: 30 mL/min — ABNORMAL LOW (ref >60)
GFR, EST AFRICAN AMERICAN: 35 mL/min — AB (ref >60)
Glucose, Bld: 169 mg/dL — ABNORMAL HIGH (ref 70–99)
POTASSIUM: 3.8 mmol/L (ref 3.5–5.1)
Sodium: 135 mmol/L (ref 135–145)

## 2018-02-07 LAB — PROTIME-INR
INR: 2.64
INR: 2.25
PROTHROMBIN TIME: 28 s — AB (ref 11.4–15.2)
PROTHROMBIN TIME: 24.7 s — AB (ref 11.4–15.2)

## 2018-02-07 LAB — CBC
HCT: 36 % (ref 35.0–47.0)
HEMOGLOBIN: 12.4 g/dL (ref 12.0–16.0)
MCH: 30.1 pg (ref 26.0–34.0)
MCHC: 34.4 g/dL (ref 32.0–36.0)
MCV: 87.6 fL (ref 80.0–100.0)
Platelets: 265 10*3/uL (ref 150–440)
RBC: 4.11 MIL/uL (ref 3.80–5.20)
RDW: 15.4 % — ABNORMAL HIGH (ref 11.5–14.5)
WBC: 7.7 10*3/uL (ref 3.6–11.0)

## 2018-02-07 LAB — MRSA PCR SCREENING: MRSA BY PCR: NEGATIVE

## 2018-02-07 MED ORDER — FERROUS SULFATE 325 (65 FE) MG PO TABS
325.0000 mg | ORAL_TABLET | Freq: Every day | ORAL | Status: DC
  Administered 2018-02-09 – 2018-02-10 (×2): 325 mg via ORAL
  Filled 2018-02-07: qty 1

## 2018-02-07 MED ORDER — METOPROLOL TARTRATE 50 MG PO TABS
50.0000 mg | ORAL_TABLET | Freq: Two times a day (BID) | ORAL | Status: DC
  Administered 2018-02-07 – 2018-02-10 (×6): 50 mg via ORAL
  Filled 2018-02-07 (×6): qty 1

## 2018-02-07 MED ORDER — QUETIAPINE FUMARATE 100 MG PO TABS
150.0000 mg | ORAL_TABLET | Freq: Every day | ORAL | Status: DC
  Administered 2018-02-07 – 2018-02-09 (×3): 150 mg via ORAL
  Filled 2018-02-07 (×3): qty 1.5
  Filled 2018-02-07 (×3): qty 6
  Filled 2018-02-07: qty 1.5

## 2018-02-07 MED ORDER — ACETAMINOPHEN 650 MG RE SUPP: 650.0000 mg | Freq: Four times a day (QID) | RECTAL | Status: DC | PRN

## 2018-02-07 MED ORDER — ONDANSETRON HCL 4 MG/2ML IJ SOLN
INTRAMUSCULAR | Status: AC
  Filled 2018-02-07: qty 2

## 2018-02-07 MED ORDER — SENNOSIDES-DOCUSATE SODIUM 8.6-50 MG PO TABS: 1.0000 | ORAL_TABLET | Freq: Every evening | ORAL | Status: DC | PRN

## 2018-02-07 MED ORDER — FENTANYL CITRATE (PF) 100 MCG/2ML IJ SOLN
50.0000 ug | Freq: Once | INTRAMUSCULAR | Status: AC
  Administered 2018-02-07: 50 ug via INTRAVENOUS
  Filled 2018-02-07: qty 2

## 2018-02-07 MED ORDER — ACETAMINOPHEN 325 MG PO TABS: 650.0000 mg | ORAL_TABLET | Freq: Four times a day (QID) | ORAL | Status: DC | PRN

## 2018-02-07 MED ORDER — KETOROLAC TROMETHAMINE 15 MG/ML IJ SOLN
15.0000 mg | Freq: Four times a day (QID) | INTRAMUSCULAR | Status: DC | PRN
  Administered 2018-02-07: 15 mg via INTRAVENOUS
  Filled 2018-02-07: qty 1

## 2018-02-07 MED ORDER — PRO-STAT SUGAR FREE PO LIQD
15.0000 mL | Freq: Two times a day (BID) | ORAL | Status: DC
  Administered 2018-02-08 – 2018-02-10 (×4): 15 mL via ORAL

## 2018-02-07 MED ORDER — NITROGLYCERIN 0.4 MG SL SUBL: 0.4000 mg | SUBLINGUAL_TABLET | SUBLINGUAL | Status: DC | PRN

## 2018-02-07 MED ORDER — ONDANSETRON HCL 4 MG/2ML IJ SOLN
4.0000 mg | Freq: Once | INTRAMUSCULAR | Status: AC
  Administered 2018-02-07: 4 mg via INTRAVENOUS

## 2018-02-07 MED ORDER — VITAMIN K1 10 MG/ML IJ SOLN
3.0000 mg | Freq: Once | INTRAVENOUS | Status: AC
  Administered 2018-02-07: 3 mg via INTRAVENOUS
  Filled 2018-02-07: qty 0.3

## 2018-02-07 MED ORDER — AMLODIPINE BESYLATE 5 MG PO TABS
5.0000 mg | ORAL_TABLET | Freq: Every day | ORAL | Status: DC
  Administered 2018-02-09 – 2018-02-10 (×2): 5 mg via ORAL
  Filled 2018-02-07 (×2): qty 1

## 2018-02-07 MED ORDER — ONDANSETRON HCL 4 MG/2ML IJ SOLN: 4.0000 mg | Freq: Four times a day (QID) | INTRAMUSCULAR | Status: DC | PRN

## 2018-02-07 MED ORDER — ATORVASTATIN CALCIUM 20 MG PO TABS
80.0000 mg | ORAL_TABLET | Freq: Every day | ORAL | Status: DC
  Administered 2018-02-07 – 2018-02-10 (×3): 80 mg via ORAL
  Filled 2018-02-07 (×3): qty 4

## 2018-02-07 MED ORDER — QUETIAPINE FUMARATE 25 MG PO TABS
50.0000 mg | ORAL_TABLET | Freq: Every day | ORAL | Status: DC
  Administered 2018-02-09 – 2018-02-10 (×2): 50 mg via ORAL
  Filled 2018-02-07 (×2): qty 2

## 2018-02-07 MED ORDER — ONDANSETRON HCL 4 MG/2ML IJ SOLN
4.0000 mg | Freq: Once | INTRAMUSCULAR | Status: AC
  Administered 2018-02-07: 4 mg via INTRAVENOUS
  Filled 2018-02-07: qty 2

## 2018-02-07 MED ORDER — ALBUTEROL SULFATE (2.5 MG/3ML) 0.083% IN NEBU: 2.5000 mg | INHALATION_SOLUTION | RESPIRATORY_TRACT | Status: DC | PRN

## 2018-02-07 MED ORDER — POTASSIUM CHLORIDE CRYS ER 10 MEQ PO TBCR
10.0000 meq | EXTENDED_RELEASE_TABLET | Freq: Every day | ORAL | Status: DC
  Administered 2018-02-09 – 2018-02-10 (×2): 10 meq via ORAL
  Filled 2018-02-07 (×2): qty 1

## 2018-02-07 MED ORDER — OLANZAPINE 2.5 MG PO TABS
1.2500 mg | ORAL_TABLET | Freq: Three times a day (TID) | ORAL | Status: DC
  Administered 2018-02-09 – 2018-02-10 (×3): 1.25 mg via ORAL
  Filled 2018-02-07 (×9): qty 0.5

## 2018-02-07 MED ORDER — DOCUSATE SODIUM 100 MG PO CAPS
100.0000 mg | ORAL_CAPSULE | Freq: Two times a day (BID) | ORAL | Status: DC
  Administered 2018-02-07: 100 mg via ORAL
  Filled 2018-02-07: qty 1

## 2018-02-07 MED ORDER — QUETIAPINE FUMARATE 25 MG PO TABS: 50.0000 mg | ORAL_TABLET | Freq: Every day | ORAL | Status: DC

## 2018-02-07 MED ORDER — ONDANSETRON HCL 4 MG PO TABS: 4.0000 mg | ORAL_TABLET | Freq: Four times a day (QID) | ORAL | Status: DC | PRN

## 2018-02-07 MED ORDER — SODIUM CHLORIDE 0.9 % IV SOLN
INTRAVENOUS | Status: DC
  Administered 2018-02-07: 22:00:00 via INTRAVENOUS

## 2018-02-07 MED ORDER — BISACODYL 5 MG PO TBEC: 5.0000 mg | DELAYED_RELEASE_TABLET | Freq: Every day | ORAL | Status: DC | PRN

## 2018-02-07 NOTE — Progress Notes (Signed)
Advanced Care Plan.  Purpose of Encounter: CODE STATUS. Parties in Attendance: The patient, her daughter and me. Patient's Decisional Capacity: Yes. Medical Story: Ashley Mullins  is a 84 y.o. female with a known history of A. fib, CAD, dementia, hyperlipidemia and depression.   She is being admitted for right hip fracture.  I discussed the patient's current condition, prognosis and CODE STATUS with the patient and her daughter.  According to her daughter, the patient is DNR.  But the patient want to be resuscitated and intubated once if necessary, but she does not want to be put on ventilator for long time.  Plan:  Code Status: Full code for now. Time spent discussing advance care planning: 17 minutes.

## 2018-02-07 NOTE — H&P (Signed)
[facility] [place] [facility]   PATIENT NAME: Ashley Mullins    MR#:  [id number]  DATE OF BIRTH:  19-Apr-1929  DATE OF ADMISSION:  02/07/2018  PRIMARY CARE PHYSICIAN: Marcina Millard, MD   REQUESTING/REFERRING PHYSICIAN: Nita Sickle, MD  CHIEF COMPLAINT:   Chief Complaint  Patient presents with  . Fall  . Leg Pain   Fall in the right hip pain today. HISTORY OF PRESENT ILLNESS:  Ashley Mullins  is a 84 y.o. female with a known history of A. fib, CAD, dementia, hyperlipidemia and depression.  The patient is sent to ED from assisted living facility due to above chief complaints.  She fell by accident today and injured right hip.  She complains of right hip pain 8 out of 10, unable to move.  She denies any other injuries.  But she complains of nausea.  X-ray showed right hip fracture.  But patient is taking Coumadin for A. fib, INR is at 2.64.  Orthopedic surgeon, Dr. Allena Katz recommend giving vitamin K and this surgery tomorrow.  PAST MEDICAL HISTORY:   Past Medical History:  Diagnosis Date  . A-fib (HCC)   . CAD (coronary artery disease)   . Dementia   . Depression   . Hyperlipidemia   . Irregular heart beat   . Pacemaker     PAST SURGICAL HISTORY:   Past Surgical History:  Procedure Laterality Date  . BACK SURGERY    . PACEMAKER INSERTION    . VAGINAL HYSTERECTOMY      SOCIAL HISTORY:   Social History   Tobacco Use  . Smoking status: Former Games developer  . Smokeless tobacco: Never Used  Substance Use Topics  . Alcohol use: No    FAMILY HISTORY:  History reviewed. No pertinent family history.  DRUG ALLERGIES:   Allergies  Allergen Reactions  . Chicken Allergy   . Codeine   . Other     Pt is allergic to [place] and chicken    REVIEW OF SYSTEMS:   Review of Systems  Constitutional: Negative for chills, fever and malaise/fatigue.  HENT: Negative for sore throat.   Eyes: Negative for blurred vision and double vision.    Respiratory: Negative for cough, hemoptysis, shortness of breath, wheezing and stridor.   Cardiovascular: Negative for chest pain, palpitations, orthopnea and leg swelling.  Gastrointestinal: Positive for nausea. Negative for abdominal pain, blood in stool, diarrhea, melena and vomiting.  Genitourinary: Negative for dysuria, flank pain and hematuria.  Musculoskeletal: Positive for joint pain. Negative for back pain.       Right hip pain.  Skin: Negative for rash.  Neurological: Negative for dizziness, sensory change, focal weakness, seizures, loss of consciousness, weakness and headaches.  Endo/Heme/Allergies: Negative for polydipsia.  Psychiatric/Behavioral: Negative for depression. The patient is not nervous/anxious.     MEDICATIONS AT HOME:   Prior to Admission medications   Medication Sig Start Date End Date Taking? Authorizing Provider  acetaminophen (TYLENOL) 650 MG CR tablet Take 650 mg by mouth every 8 (eight) hours.   Yes [provider]  Amino Acids-Protein Hydrolys (FEEDING SUPPLEMENT, PRO-STAT SUGAR FREE 64,) LIQD Take 15 mLs by mouth 2 (two) times daily.   Yes [provider]  amLODipine (NORVASC) 5 MG tablet Take 5 mg by mouth daily. 08/05/16  Yes [provider]  atorvastatin (LIPITOR) 80 MG tablet Take 80 mg by mouth daily. 08/28/16  Yes [provider]  ferrous sulfate 325 (65 FE) MG tablet Take 325 mg  by mouth daily with breakfast.   Yes [provider]  furosemide (LASIX) 40 MG tablet Take 40 mg by mouth daily.   Yes [provider]  metoprolol (LOPRESSOR) 50 MG tablet Take 50 mg by mouth 2 (two) times daily.  07/15/16  Yes [provider]  nitroGLYCERIN (NITROSTAT) 0.4 MG SL tablet Place 0.4 mg under the tongue every 5 (five) minutes as needed for chest pain.   Yes [provider]  potassium chloride (K-DUR,KLOR-CON) 10 MEQ tablet Take 10 mEq by mouth daily.   Yes [provider]  QUEtiapine  (SEROQUEL) 50 MG tablet Take 1 tablet (50 mg total) by mouth at bedtime. Patient taking differently: Take 50-150 mg by mouth daily. Take 50 mg by mouth in the morning and 150 mg by mouth at bedtime. 03/26/17  Yes Sharman Cheek, MD  warfarin (COUMADIN) 1 MG tablet Take 1 mg by mouth daily.   Yes [provider]  memantine (NAMENDA) 5 MG tablet Take 1 tablet (5 mg total) by mouth daily. Patient not taking: Reported on 02/07/2018 01/05/17   Arnaldo Natal, MD  OLANZapine (ZYPREXA) 5 MG tablet Take 0.5 tablets (2.5 mg total) by mouth 3 (three) times daily with meals. 01/05/17 01/05/18  Arnaldo Natal, MD  warfarin (COUMADIN) 2 MG tablet Take 1.5 tablets (3 mg total) by mouth daily. Patient not taking: Reported on 02/07/2018 01/05/17   Loleta Rose, MD      VITAL SIGNS:  Blood pressure 126/72, pulse 80, temperature 97.6 F (36.4 C), temperature source Oral, resp. rate 16, height 5\' 6"  (1.676 m), weight 59 kg, SpO2 99 %.  PHYSICAL EXAMINATION:  Physical Exam  GENERAL:  84 y.o.-year-old patient lying in the bed with no acute distress.  EYES: Pupils equal, round, reactive to light and accommodation. No scleral icterus. Extraocular muscles intact.  HEENT: Head atraumatic, normocephalic. Oropharynx and nasopharynx clear.  NECK:  Supple, no jugular venous distention. No thyroid enlargement, no tenderness.  LUNGS: Normal breath sounds bilaterally, no wheezing, rales,rhonchi or crepitation. No use of accessory muscles of respiration.  CARDIOVASCULAR: S1, S2 normal. No murmurs, rubs, or gallops.  ABDOMEN: Soft, nontender, nondistended. Bowel sounds present. No organomegaly or mass.  EXTREMITIES: No pedal edema, cyanosis, or clubbing.  NEUROLOGIC: Cranial nerves II through XII are intact.  Unable to move right leg.  Sensation intact. Gait not checked.  PSYCHIATRIC: The patient is alert and oriented x 3.  SKIN: No obvious rash, lesion, or ulcer.   LABORATORY PANEL:   CBC Recent Labs  Lab  02/07/18 1855  WBC 7.7  HGB 12.4  HCT 36.0  PLT 265   ------------------------------------------------------------------------------------------------------------------  Chemistries  Recent Labs  Lab 02/07/18 1855  NA 135  K 3.8  CL 101  CO2 27  GLUCOSE 169*  BUN 33*  CREATININE 1.51*  CALCIUM 8.2*   ------------------------------------------------------------------------------------------------------------------  Cardiac Enzymes No results for input(s): TROPONINI in the last 168 hours. ------------------------------------------------------------------------------------------------------------------  RADIOLOGY:  Dg Chest Portable 1 View  Result Date: 02/07/2018 CLINICAL DATA:  Status post fall.  Preop evaluation. EXAM: PORTABLE CHEST 1 VIEW COMPARISON:  Chest x-ray dated 09/14/2017. FINDINGS: Stable mild cardiomegaly. Prominent mitral annulus calcifications. Atherosclerotic calcifications noted at the aortic arch. LEFT chest wall pacemaker leads appear stable in position. Lungs are clear. Osseous structures about the chest are unremarkable. IMPRESSION: 1. No active disease. No evidence of pneumonia or pulmonary edema. Stable mild cardiomegaly. 2.  Aortic Atherosclerosis (ICD10-I70.0). Electronically Signed   By: Anne Ng.D.  On: 02/07/2018 19:27   Dg Hip Unilat  With Pelvis 2-3 Views Right  Result Date: 02/07/2018 CLINICAL DATA:  Fall. EXAM: DG HIP (WITH OR WITHOUT PELVIS) 2-3V RIGHT COMPARISON:  None. FINDINGS: Markedly displaced/comminuted fracture of the RIGHT femoral neck, subcapital and intertrochanteric distribution, with approximately 90 degrees angulation deformity at the main fracture site. Femoral head remains appropriately positioned relative to the acetabulum. No additional fracture seen within the osseous pelvis, although diffuse osteopenia limits characterization of osseous detail. IMPRESSION: Markedly displaced/comminuted fracture of the RIGHT femoral neck,  subcapital and intertrochanteric distribution, with approximately 90 degrees angulation deformity at the main fracture site. Electronically Signed   By: Bary RichardStan  Maynard M.D.   On: 02/07/2018 19:26   Dg Femur, Min 2 Views Right  Result Date: 02/07/2018 CLINICAL DATA:  Fall. EXAM: RIGHT FEMUR 2 VIEWS COMPARISON:  None. FINDINGS: There is a comminuted and displaced right femoral intertrochanteric fracture. Severe varus angulation. No subluxation or dislocation. No additional femoral abnormality. IMPRESSION: Severely angulated and displaced right femoral intertrochanteric fracture. Electronically Signed   By: Charlett NoseKevin  Dover M.D.   On: 02/07/2018 20:24      IMPRESSION AND PLAN:   Right hip fracture. The patient will be admitted to medical floor. Moderate to high risk for surgery.  Cardiology consult for clearance. Hold Coumadin, give vitamin K 3 mg 1 dose, follow-up INR. N.p.o. after midnight for possible surgery tomorrow per Dr. Allena KatzPatel, orthopedic surgeon. Pain control. Acute renal failure due to dehydration.  IV fluid support and follow-up BMP.  Hold Lasix. Chronic A. Fib.  Hold Coumadin, continue Lopressor.  All the records are reviewed and case discussed with ED provider. Management plans discussed with the patient, her daughter and they are in agreement.  CODE STATUS: Full code.  TOTAL TIME TAKING CARE OF THIS PATIENT: 38 minutes.    Shaune PollackQing Dylan Monforte M.D on 02/07/2018 at 8:38 PM  Between 7am to 6pm - Pager - [phone]  After 6pm go to [email or web address] - Social research officer, governmentpassword [email or web address]  [facility]  Office  [phone]  CC: Primary care physician; Marcina MillardParaschos, Alexander, MD   Note: This dictation was prepared with Dragon dictation along with smaller phrase technology. Any transcriptional errors that result from this process are unin

## 2018-02-07 NOTE — ED Notes (Signed)
Patient taken to xray.

## 2018-02-07 NOTE — ED Provider Notes (Signed)
[facility] Emergency Department Provider Note  ____________________________________________  Time seen: Approximately 7:39 PM  I have reviewed the triage vital signs and the nursing notes.   HISTORY  Chief Complaint Fall and Leg Pain   HPI Ashley Mullins is a 84 y.o. female on coumadin who presents for evaluation of fall and R hip pain. Patient reports that she was in the living room of the assisted living facilitywhen the light when off. She tried to get up and walk out when she tripped on something on the ground and fell onto her R hip. She she complain of 8 out of 10 sharp pain located in her right hip that has been constant since the fall.  Patient unable to stand up and ambulate after the fall.  She denies head trauma or LOC.  She is on Coumadin for A. fib.  She denies neck pain, back pain, headache, chest pain.  Past Medical History:  Diagnosis Date  . A-fib (HCC)   . CAD (coronary artery disease)   . Dementia   . Depression   . Hyperlipidemia   . Irregular heart beat   . Pacemaker     Patient Active Problem List   Diagnosis Date Noted  . Closed right hip fracture (HCC) 02/07/2018  . Dementia with psychosis 08/29/2016    Past Surgical History:  Procedure Laterality Date  . BACK SURGERY    . PACEMAKER INSERTION    . VAGINAL HYSTERECTOMY      Prior to Admission medications   Medication Sig Start Date End Date Taking? Authorizing Provider  acetaminophen (TYLENOL) 650 MG CR tablet Take 650 mg by mouth every 8 (eight) hours.   Yes [provider]  Amino Acids-Protein Hydrolys (FEEDING SUPPLEMENT, PRO-STAT SUGAR FREE 64,) LIQD Take 15 mLs by mouth 2 (two) times daily.   Yes [provider]  amLODipine (NORVASC) 5 MG tablet Take 5 mg by mouth daily. 08/05/16  Yes [provider]  atorvastatin (LIPITOR) 80 MG tablet Take 80 mg by mouth daily. 08/28/16  Yes [provider]  ferrous sulfate 325 (65 FE)  MG tablet Take 325 mg by mouth daily with breakfast.   Yes [provider]  furosemide (LASIX) 40 MG tablet Take 40 mg by mouth daily.   Yes [provider]  metoprolol (LOPRESSOR) 50 MG tablet Take 50 mg by mouth 2 (two) times daily.  07/15/16  Yes [provider]  nitroGLYCERIN (NITROSTAT) 0.4 MG SL tablet Place 0.4 mg under the tongue every 5 (five) minutes as needed for chest pain.   Yes [provider]  potassium chloride (K-DUR,KLOR-CON) 10 MEQ tablet Take 10 mEq by mouth daily.   Yes [provider]  QUEtiapine (SEROQUEL) 50 MG tablet Take 1 tablet (50 mg total) by mouth at bedtime. Patient taking differently: Take 50-150 mg by mouth daily. Take 50 mg by mouth in the morning and 150 mg by mouth at bedtime. 03/26/17  Yes Sharman Cheek, MD  warfarin (COUMADIN) 1 MG tablet Take 1 mg by mouth daily.   Yes [provider]  memantine (NAMENDA) 5 MG tablet Take 1 tablet (5 mg total) by mouth daily. Patient not taking: Reported on 02/07/2018 01/05/17   Arnaldo Natal, MD  OLANZapine (ZYPREXA) 5 MG tablet Take 0.5 tablets (2.5 mg total) by mouth 3 (three) times daily with meals. 01/05/17 01/05/18  Arnaldo Natal, MD  warfarin (COUMADIN) 2 MG tablet Take 1.5 tablets (3 mg total) by mouth  daily. Patient not taking: Reported on 02/07/2018 01/05/17   Loleta RoseForbach, Cory, MD    Allergies Chicken allergy; Codeine; and Other  History reviewed. No pertinent family history.  Social History Social History   Tobacco Use  . Smoking status: Former Games developermoker  . Smokeless tobacco: Never Used  Substance Use Topics  . Alcohol use: No  . Drug use: No    Review of Systems Constitutional: Negative for fever. Eyes: Negative for visual changes. ENT: Negative for facial injury or neck injury Cardiovascular: Negative for chest injury. Respiratory: Negative for shortness of breath. Negative for chest wall injury. Gastrointestinal: Negative for abdominal pain or  injury. Genitourinary: Negative for dysuria. Musculoskeletal: Negative for back injury, + R hip pain Skin: Negative for laceration/abrasions. Neurological: Negative for head injury.   ____________________________________________   PHYSICAL EXAM:  VITAL SIGNS: ED Triage Vitals  Enc Vitals Group     BP 02/07/18 1830 (!) 145/116     Pulse Rate 02/07/18 1830 71     Resp 02/07/18 1830 18     Temp 02/07/18 1830 97.6 F (36.4 C)     Temp Source 02/07/18 1830 Oral     SpO2 02/07/18 1830 100 %     Weight 02/07/18 1823 130 lb (59 kg)     Height 02/07/18 1823 5\' 6"  (1.676 m)     Head Circumference --      Peak Flow --      Pain Score --      Pain Loc --      Pain Edu? --      Excl. in GC? --    Full spinal precautions maintained throughout the trauma exam. Constitutional: Alert and oriented. No acute distress. Does not appear intoxicated. HEENT Head: Normocephalic and atraumatic. Face: No facial bony tenderness. Stable midface Ears: No hemotympanum bilaterally. No Battle sign Eyes: No eye injury. PERRL. No raccoon eyes Nose: Nontender. No epistaxis. No rhinorrhea Mouth/Throat: Mucous membranes are moist. No oropharyngeal blood. No dental injury. Airway patent without stridor. Normal voice. Neck: no C-collar in place. No midline c-spine tenderness.  Cardiovascular: Normal rate, regular rhythm. Normal and symmetric distal pulses are present in all extremities. Pulmonary/Chest: Chest wall is stable and nontender to palpation/compression. Normal respiratory effort. Breath sounds are normal. No crepitus.  Abdominal: Soft, nontender, non distended. Musculoskeletal: tender to palpation of the R proximal femoral head/ neck region. Nontender with normal full range of motion in all other extremities. No deformities. No thoracic or lumbar midline spinal tenderness. Pelvis is stable. Skin: Skin is warm, dry and intact. No abrasions or contutions. Psychiatric: Speech and behavior are  appropriate. Neurological: Normal speech and language. Moves all extremities to command. No gross focal neurologic deficits are appreciated.  Glascow Coma Score: 4 - Opens eyes on own 6 - Follows simple motor commands 5 - Alert and oriented GCS: 15  ____________________________________________   LABS (all labs ordered are listed, but only abnormal results are displayed)  Labs Reviewed  CBC - Abnormal; Notable for the following components:      Result Value   RDW 15.4 (*)    All other components within normal limits  BASIC METABOLIC PANEL - Abnormal; Notable for the following components:   Glucose, Bld 169 (*)    BUN 33 (*)    Creatinine, Ser 1.51 (*)    Calcium 8.2 (*)    GFR calc non Af Amer 30 (*)    GFR calc Af Amer 35 (*)    All other components within  normal limits  PROTIME-INR - Abnormal; Notable for the following components:   Prothrombin Time 28.0 (*)    All other components within normal limits  TYPE AND SCREEN  TYPE AND SCREEN   ____________________________________________  EKG  ED ECG REPORT I, Nita Sickle, the attending physician, personally viewed and interpreted this ECG.  Atrial fibrillation with occasional paced complexes, rate of 65, right bundle branch block, left axis deviation, normal QTC, no concordant ST elevations or depressions.  Unchanged from prior ____________________________________________  RADIOLOGY  I have personally reviewed the images performed during this visit and I agree with the Radiologist's read.   Interpretation by Radiologist:  Dg Chest Portable 1 View  Result Date: 02/07/2018 CLINICAL DATA:  Status post fall.  Preop evaluation. EXAM: PORTABLE CHEST 1 VIEW COMPARISON:  Chest x-ray dated 09/14/2017. FINDINGS: Stable mild cardiomegaly. Prominent mitral annulus calcifications. Atherosclerotic calcifications noted at the aortic arch. LEFT chest wall pacemaker leads appear stable in position. Lungs are clear. Osseous  structures about the chest are unremarkable. IMPRESSION: 1. No active disease. No evidence of pneumonia or pulmonary edema. Stable mild cardiomegaly. 2.  Aortic Atherosclerosis (ICD10-I70.0). Electronically Signed   By: Bary Richard M.D.   On: 02/07/2018 19:27   Dg Hip Unilat  With Pelvis 2-3 Views Right  Result Date: 02/07/2018 CLINICAL DATA:  Fall. EXAM: DG HIP (WITH OR WITHOUT PELVIS) 2-3V RIGHT COMPARISON:  None. FINDINGS: Markedly displaced/comminuted fracture of the RIGHT femoral neck, subcapital and intertrochanteric distribution, with approximately 90 degrees angulation deformity at the main fracture site. Femoral head remains appropriately positioned relative to the acetabulum. No additional fracture seen within the osseous pelvis, although diffuse osteopenia limits characterization of osseous detail. IMPRESSION: Markedly displaced/comminuted fracture of the RIGHT femoral neck, subcapital and intertrochanteric distribution, with approximately 90 degrees angulation deformity at the main fracture site. Electronically Signed   By: Bary Richard M.D.   On: 02/07/2018 19:26   Dg Femur, Min 2 Views Right  Result Date: 02/07/2018 CLINICAL DATA:  Fall. EXAM: RIGHT FEMUR 2 VIEWS COMPARISON:  None. FINDINGS: There is a comminuted and displaced right femoral intertrochanteric fracture. Severe varus angulation. No subluxation or dislocation. No additional femoral abnormality. IMPRESSION: Severely angulated and displaced right femoral intertrochanteric fracture. Electronically Signed   By: Charlett Nose M.D.   On: 02/07/2018 20:24     ____________________________________________   PROCEDURES  Procedure(s) performed: None Procedures Critical Care performed:  None ____________________________________________   INITIAL IMPRESSION / ASSESSMENT AND PLAN / ED COURSE  84 y.o. female on coumadin who presents for evaluation of fall and R hip pain. Patient denies head trauma, no LOC, no signs or symptoms  of basilar skull fracture or head trauma on exam.  X-ray consistent with markedly displaced and comminuted fracture of the right femoral neck, subcapital and intertrochanteric.  Discussed with Dr. Allena Katz from Ortho who recommended IV vitamin K and admission for surgical repair tomorrow. Labs, CXR, and EKG for medical clearance done.  Discussed with Dr. Imogene Burn for admission.      As part of my medical decision making, I reviewed the following data within the electronic MEDICAL RECORD NUMBER Nursing notes reviewed and incorporated, Labs reviewed , EKG interpreted , Old chart reviewed, Radiograph reviewed , Discussed with admitting physician , A consult was requested and obtained from this/these consultant(s) Orthopedics, Notes from prior ED visits and [place] Controlled Substance Database    Pertinent labs & imaging results that were available during my care of the patient were reviewed by me and considered  in my medical decision making (see chart for details).    ____________________________________________   FINAL CLINICAL IMPRESSION(S) / ED DIAGNOSES  Final diagnoses:  Fall, initial encounter  Closed fracture of right hip, initial encounter (HCC)      NEW MEDICATIONS STARTED DURING THIS VISIT:  ED Discharge Orders    None       Note:  This document was prepared using Dragon voice recognition software and may include unintentional dictation errors.    Nita Sickle, MD 02/07/18 [phone]

## 2018-02-07 NOTE — ED Notes (Signed)
Pt with noted shortening and rotation to R leg upon assessment. Pt c/o pain "across the bottom of her stomach", around her groin area and "lower".

## 2018-02-07 NOTE — ED Triage Notes (Signed)
Pt presents to ED via [facility] from [facility] with c/o fall. Per EMS pt fell near the bed, pt states she was going past the bed "when my leg give out". Per EMS pt c/o R leg pain at this time. VSS, BP 159/67, HR 75, O2 98%. Pt is alert and oriented to person, place, situation, is noted disoriented to time.

## 2018-02-08 ENCOUNTER — Inpatient Hospital Stay: Payer: Medicare Other

## 2018-02-08 ENCOUNTER — Inpatient Hospital Stay: Payer: Medicare Other | Admitting: Certified Registered"

## 2018-02-08 ENCOUNTER — Encounter
Admission: EM | Disposition: A | Discharge status: Skilled Nursing Facility | Payer: Self-pay | Source: Home / Self Care | Attending: Internal Medicine

## 2018-02-08 ENCOUNTER — Encounter: Payer: Self-pay | Admitting: Anesthesiology

## 2018-02-08 HISTORY — PX: INTRAMEDULLARY (IM) NAIL INTERTROCHANTERIC: SHX5875

## 2018-02-08 LAB — BASIC METABOLIC PANEL
Anion gap: 11 (ref 5–15)
BUN: 32 mg/dL — ABNORMAL HIGH (ref 8–23)
CALCIUM: 8.3 mg/dL — AB (ref 8.9–10.3)
CO2: 27 mmol/L (ref 22–32)
Chloride: 100 mmol/L (ref 98–111)
Creatinine, Ser: 1.53 mg/dL — ABNORMAL HIGH (ref 0.44–1.00)
GFR calc Af Amer: 34 mL/min — ABNORMAL LOW (ref >60)
GFR, EST NON AFRICAN AMERICAN: 29 mL/min — AB (ref >60)
GLUCOSE: 135 mg/dL — AB (ref 70–99)
Potassium: 4 mmol/L (ref 3.5–5.1)
SODIUM: 138 mmol/L (ref 135–145)

## 2018-02-08 LAB — CBC
HCT: 32.8 % — ABNORMAL LOW (ref 35.0–47.0)
HEMOGLOBIN: 11.1 g/dL — AB (ref 12.0–16.0)
MCH: 29.5 pg (ref 26.0–34.0)
MCHC: 33.7 g/dL (ref 32.0–36.0)
MCV: 87.5 fL (ref 80.0–100.0)
Platelets: 249 10*3/uL (ref 150–440)
RBC: 3.75 MIL/uL — ABNORMAL LOW (ref 3.80–5.20)
RDW: 15.7 % — AB (ref 11.5–14.5)
WBC: 8 10*3/uL (ref 3.6–11.0)

## 2018-02-08 LAB — PROTIME-INR
INR: 1.56
INR: 1.44
PROTHROMBIN TIME: 18.5 s — AB (ref 11.4–15.2)
Prothrombin Time: 17.4 seconds — ABNORMAL HIGH (ref 11.4–15.2)

## 2018-02-08 SURGERY — FIXATION, FRACTURE, INTERTROCHANTERIC, WITH INTRAMEDULLARY ROD
Anesthesia: General | Site: Hip | Laterality: Right | Wound class: "Clean "

## 2018-02-08 MED ORDER — LIDOCAINE HCL (PF) 2 % IJ SOLN
INTRAMUSCULAR | Status: AC
  Filled 2018-02-08: qty 10

## 2018-02-08 MED ORDER — FENTANYL CITRATE (PF) 100 MCG/2ML IJ SOLN
INTRAMUSCULAR | Status: DC | PRN
  Administered 2018-02-08: 50 ug via INTRAVENOUS

## 2018-02-08 MED ORDER — SODIUM CHLORIDE 0.9 % IV SOLN
INTRAVENOUS | Status: DC
  Administered 2018-02-08 – 2018-02-09 (×3): via INTRAVENOUS

## 2018-02-08 MED ORDER — METOCLOPRAMIDE HCL 10 MG PO TABS: 5.0000 mg | ORAL_TABLET | Freq: Three times a day (TID) | ORAL | Status: DC | PRN

## 2018-02-08 MED ORDER — METHOCARBAMOL 1000 MG/10ML IJ SOLN
500.0000 mg | Freq: Four times a day (QID) | INTRAVENOUS | Status: DC | PRN
  Filled 2018-02-08: qty 5

## 2018-02-08 MED ORDER — BISACODYL 10 MG RE SUPP: 10.0000 mg | Freq: Every day | RECTAL | Status: DC | PRN

## 2018-02-08 MED ORDER — WARFARIN SODIUM 2 MG PO TABS
2.0000 mg | ORAL_TABLET | Freq: Once | ORAL | Status: DC
  Filled 2018-02-08: qty 1

## 2018-02-08 MED ORDER — BUPIVACAINE LIPOSOME 1.3 % IJ SUSP
INTRAMUSCULAR | Status: DC | PRN
  Administered 2018-02-08: 50 mL

## 2018-02-08 MED ORDER — FLEET ENEMA 7-19 GM/118ML RE ENEM: 1.0000 | ENEMA | Freq: Once | RECTAL | Status: DC | PRN

## 2018-02-08 MED ORDER — WARFARIN SODIUM 1 MG PO TABS: 1.0000 mg | ORAL_TABLET | Freq: Every day | ORAL | Status: DC

## 2018-02-08 MED ORDER — CEFAZOLIN SODIUM-DEXTROSE 1-4 GM/50ML-% IV SOLN
1.0000 g | Freq: Four times a day (QID) | INTRAVENOUS | Status: AC
  Administered 2018-02-08 – 2018-02-09 (×3): 1 g via INTRAVENOUS
  Filled 2018-02-08 (×3): qty 50

## 2018-02-08 MED ORDER — CEFAZOLIN SODIUM-DEXTROSE 1-4 GM/50ML-% IV SOLN
1.0000 g | Freq: Three times a day (TID) | INTRAVENOUS | Status: DC
  Administered 2018-02-08: 1 g via INTRAVENOUS
  Filled 2018-02-08: qty 50

## 2018-02-08 MED ORDER — SENNOSIDES-DOCUSATE SODIUM 8.6-50 MG PO TABS
1.0000 | ORAL_TABLET | Freq: Every evening | ORAL | Status: DC | PRN
  Administered 2018-02-08: 1 via ORAL
  Filled 2018-02-08: qty 1

## 2018-02-08 MED ORDER — ONDANSETRON HCL 4 MG/2ML IJ SOLN
4.0000 mg | Freq: Four times a day (QID) | INTRAMUSCULAR | Status: DC | PRN
  Administered 2018-02-08: 4 mg via INTRAVENOUS
  Filled 2018-02-08: qty 2

## 2018-02-08 MED ORDER — PROPOFOL 10 MG/ML IV BOLUS
INTRAVENOUS | Status: DC | PRN
  Administered 2018-02-08: 130 mg via INTRAVENOUS

## 2018-02-08 MED ORDER — ENOXAPARIN SODIUM 30 MG/0.3ML ~~LOC~~ SOLN
30.0000 mg | SUBCUTANEOUS | Status: DC
  Administered 2018-02-09 – 2018-02-10 (×2): 30 mg via SUBCUTANEOUS
  Filled 2018-02-08 (×2): qty 0.3

## 2018-02-08 MED ORDER — OXYCODONE HCL 5 MG PO TABS: 2.5000 mg | ORAL_TABLET | ORAL | Status: DC | PRN

## 2018-02-08 MED ORDER — PHENYLEPHRINE HCL 10 MG/ML IJ SOLN
INTRAMUSCULAR | Status: DC | PRN
  Administered 2018-02-08 (×3): 100 ug via INTRAVENOUS
  Administered 2018-02-08: 150 ug via INTRAVENOUS
  Administered 2018-02-08 (×2): 100 ug via INTRAVENOUS

## 2018-02-08 MED ORDER — ACETAMINOPHEN 500 MG PO TABS
1000.0000 mg | ORAL_TABLET | Freq: Three times a day (TID) | ORAL | Status: AC
  Administered 2018-02-08 – 2018-02-09 (×2): 1000 mg via ORAL
  Filled 2018-02-08 (×3): qty 2

## 2018-02-08 MED ORDER — DOCUSATE SODIUM 100 MG PO CAPS
100.0000 mg | ORAL_CAPSULE | Freq: Two times a day (BID) | ORAL | Status: DC
  Administered 2018-02-08 – 2018-02-10 (×4): 100 mg via ORAL
  Filled 2018-02-08 (×4): qty 1

## 2018-02-08 MED ORDER — SODIUM CHLORIDE 0.9 % IV SOLN
INTRAVENOUS | Status: DC | PRN
  Administered 2018-02-08: 30 ug/min via INTRAVENOUS

## 2018-02-08 MED ORDER — OXYCODONE HCL 5 MG PO TABS: 5.0000 mg | ORAL_TABLET | ORAL | Status: DC | PRN

## 2018-02-08 MED ORDER — TRAMADOL HCL 50 MG PO TABS
50.0000 mg | ORAL_TABLET | Freq: Four times a day (QID) | ORAL | Status: DC | PRN
  Administered 2018-02-09: 50 mg via ORAL
  Filled 2018-02-08: qty 1

## 2018-02-08 MED ORDER — ONDANSETRON HCL 4 MG PO TABS: 4.0000 mg | ORAL_TABLET | Freq: Four times a day (QID) | ORAL | Status: DC | PRN

## 2018-02-08 MED ORDER — WARFARIN SODIUM 4 MG PO TABS
4.0000 mg | ORAL_TABLET | Freq: Once | ORAL | Status: AC
  Administered 2018-02-08: 4 mg via ORAL
  Filled 2018-02-08: qty 1

## 2018-02-08 MED ORDER — ACETAMINOPHEN 10 MG/ML IV SOLN
INTRAVENOUS | Status: DC | PRN
  Administered 2018-02-08: 1000 mg via INTRAVENOUS

## 2018-02-08 MED ORDER — OXYCODONE HCL 5 MG/5ML PO SOLN: 5.0000 mg | Freq: Once | ORAL | Status: DC | PRN

## 2018-02-08 MED ORDER — LACTATED RINGERS IV SOLN
INTRAVENOUS | Status: DC | PRN
  Administered 2018-02-08 (×2): via INTRAVENOUS

## 2018-02-08 MED ORDER — ONDANSETRON HCL 4 MG/2ML IJ SOLN
INTRAMUSCULAR | Status: DC | PRN
  Administered 2018-02-08: 4 mg via INTRAVENOUS

## 2018-02-08 MED ORDER — METOCLOPRAMIDE HCL 5 MG/ML IJ SOLN: 5.0000 mg | Freq: Three times a day (TID) | INTRAMUSCULAR | Status: DC | PRN

## 2018-02-08 MED ORDER — BUPIVACAINE HCL (PF) 0.5 % IJ SOLN
INTRAMUSCULAR | Status: AC
  Filled 2018-02-08: qty 30

## 2018-02-08 MED ORDER — HYDROMORPHONE HCL 1 MG/ML IJ SOLN: 0.2500 mg | INTRAMUSCULAR | Status: DC | PRN

## 2018-02-08 MED ORDER — FENTANYL CITRATE (PF) 100 MCG/2ML IJ SOLN: 25.0000 ug | INTRAMUSCULAR | Status: DC | PRN

## 2018-02-08 MED ORDER — WARFARIN SODIUM 3 MG PO TABS: 3.0000 mg | ORAL_TABLET | Freq: Every day | ORAL | Status: DC

## 2018-02-08 MED ORDER — SODIUM CHLORIDE 0.9 % IV BOLUS: 250.0000 mL | Freq: Once | INTRAVENOUS | Status: DC

## 2018-02-08 MED ORDER — ENOXAPARIN SODIUM 40 MG/0.4ML ~~LOC~~ SOLN: 40.0000 mg | SUBCUTANEOUS | Status: DC

## 2018-02-08 MED ORDER — NEOMYCIN-POLYMYXIN B GU 40-200000 IR SOLN
Status: AC
  Filled 2018-02-08: qty 4

## 2018-02-08 MED ORDER — SODIUM CHLORIDE 0.9 % IR SOLN
Status: DC | PRN
  Administered 2018-02-08: 1000 mL

## 2018-02-08 MED ORDER — VITAMIN K1 10 MG/ML IJ SOLN
1.0000 mg | Freq: Once | INTRAVENOUS | Status: AC
  Administered 2018-02-08: 1 mg via INTRAVENOUS
  Filled 2018-02-08: qty 0.1

## 2018-02-08 MED ORDER — ACETAMINOPHEN 10 MG/ML IV SOLN
INTRAVENOUS | Status: AC
  Filled 2018-02-08: qty 100

## 2018-02-08 MED ORDER — OXYCODONE HCL 5 MG PO TABS: 5.0000 mg | ORAL_TABLET | Freq: Once | ORAL | Status: DC | PRN

## 2018-02-08 MED ORDER — LIDOCAINE HCL (CARDIAC) PF 100 MG/5ML IV SOSY
PREFILLED_SYRINGE | INTRAVENOUS | Status: DC | PRN
  Administered 2018-02-08: 100 mg via INTRAVENOUS

## 2018-02-08 MED ORDER — WARFARIN - PHARMACIST DOSING INPATIENT: Freq: Every day | Status: DC

## 2018-02-08 MED ORDER — METHOCARBAMOL 500 MG PO TABS: 500.0000 mg | ORAL_TABLET | Freq: Four times a day (QID) | ORAL | Status: DC | PRN

## 2018-02-08 MED ORDER — SUGAMMADEX SODIUM 200 MG/2ML IV SOLN
INTRAVENOUS | Status: DC | PRN
  Administered 2018-02-08: 120 mg via INTRAVENOUS

## 2018-02-08 MED ORDER — KETOROLAC TROMETHAMINE 30 MG/ML IJ SOLN
INTRAMUSCULAR | Status: DC | PRN
  Administered 2018-02-08: 15 mg via INTRAVENOUS

## 2018-02-08 MED ORDER — FENTANYL CITRATE (PF) 100 MCG/2ML IJ SOLN
INTRAMUSCULAR | Status: AC
  Filled 2018-02-08: qty 2

## 2018-02-08 MED ORDER — ROCURONIUM BROMIDE 100 MG/10ML IV SOLN
INTRAVENOUS | Status: DC | PRN
  Administered 2018-02-08: 50 mg via INTRAVENOUS
  Administered 2018-02-08: 10 mg via INTRAVENOUS

## 2018-02-08 MED ORDER — BUPIVACAINE LIPOSOME 1.3 % IJ SUSP
INTRAMUSCULAR | Status: AC
  Filled 2018-02-08: qty 20

## 2018-02-08 SURGICAL SUPPLY — 48 items
"PENCIL ELECTRO HAND CTR " (MISCELLANEOUS) ×1 IMPLANT
BIT DRILL 4.3MMS DISTAL GRDTED (BIT) IMPLANT
BLADE SURG 15 STRL LF DISP TIS (BLADE) ×1 IMPLANT
BLADE SURG 15 STRL SS (BLADE) ×2
CANISTER SUCT 1200ML W/VALVE (MISCELLANEOUS) ×3 IMPLANT
CHLORAPREP W/TINT 26ML (MISCELLANEOUS) ×3 IMPLANT
DRAPE SHEET LG 3/4 BI-LAMINATE (DRAPES) ×3 IMPLANT
DRAPE SURG 17X11 SM STRL (DRAPES) ×6 IMPLANT
DRAPE U-SHAPE 47X51 STRL (DRAPES) ×6 IMPLANT
DRILL 4.3MMS DISTAL GRADUATED (BIT) ×6
DRSG OPSITE POSTOP 3X4 (GAUZE/BANDAGES/DRESSINGS) ×9 IMPLANT
ELECT REM PT RETURN 9FT ADLT (ELECTROSURGICAL) ×3
ELECTRODE REM PT RTRN 9FT ADLT (ELECTROSURGICAL) ×1 IMPLANT
GLOVE BIOGEL PI IND STRL 8 (GLOVE) ×1 IMPLANT
GLOVE BIOGEL PI INDICATOR 8 (GLOVE) ×2
GLOVE SURG SYN 7.5  E (GLOVE) ×2
GLOVE SURG SYN 7.5 E (GLOVE) ×1 IMPLANT
GLOVE SURG SYN 7.5 PF PI (GLOVE) ×1 IMPLANT
GOWN STRL REUS W/ TWL LRG LVL3 (GOWN DISPOSABLE) ×1 IMPLANT
GOWN STRL REUS W/ TWL XL LVL3 (GOWN DISPOSABLE) ×1 IMPLANT
GOWN STRL REUS W/TWL LRG LVL3 (GOWN DISPOSABLE) ×2
GOWN STRL REUS W/TWL XL LVL3 (GOWN DISPOSABLE) ×2
GUIDEPIN VERSANAIL DSP 3.2X444 (ORTHOPEDIC DISPOSABLE SUPPLIES) ×4 IMPLANT
GUIDEWIRE BALL NOSE 100CM (WIRE) ×2 IMPLANT
HIP FRA NAIL LAG SCREW 10.5X90 (Orthopedic Implant) ×3 IMPLANT
KIT PATIENT CARE HANA TABLE (KITS) ×3 IMPLANT
KIT TURNOVER KIT A (KITS) ×3 IMPLANT
MAT ABSORB  FLUID 56X50 GRAY (MISCELLANEOUS) ×4
MAT ABSORB FLUID 56X50 GRAY (MISCELLANEOUS) ×2 IMPLANT
NAIL HIP FRACTURE 11X380MM (Nail) ×2 IMPLANT
NDL FILTER BLUNT 18X1 1/2 (NEEDLE) ×1 IMPLANT
NEEDLE FILTER BLUNT 18X 1/2SAF (NEEDLE) ×2
NEEDLE FILTER BLUNT 18X1 1/2 (NEEDLE) ×1 IMPLANT
NEEDLE HYPO 22GX1.5 SAFETY (NEEDLE) ×3 IMPLANT
NS IRRIG 1000ML POUR BTL (IV SOLUTION) ×3 IMPLANT
PACK HIP COMPR (MISCELLANEOUS) ×3 IMPLANT
PENCIL ELECTRO HAND CTR (MISCELLANEOUS) ×3 IMPLANT
SCREW ANTI-ROTATION 75MM (Screw) ×2 IMPLANT
SCREW BONE CORTICAL 5.0X40 (Screw) ×2 IMPLANT
SCREW BONE CORTICAL 5.0X44 (Screw) ×2 IMPLANT
SCREW DRILL BIT ANIT ROTATION (BIT) ×2 IMPLANT
SCREW LAG HIP FRA NAIL 10.5X90 (Orthopedic Implant) IMPLANT
SCREWDRIVER HEX TIP 3.5MM (MISCELLANEOUS) ×2 IMPLANT
STAPLER SKIN PROX 35W (STAPLE) ×3 IMPLANT
SUT VIC AB 2-0 CT2 27 (SUTURE) ×3 IMPLANT
SYR 10ML LL (SYRINGE) ×3 IMPLANT
SYR 30ML LL (SYRINGE) ×3 IMPLANT
TAPE CLOTH 3X10 WHT NS LF (GAUZE/BANDAGES/DRESSINGS) ×6 IMPLANT

## 2018-02-08 NOTE — Consult Note (Signed)
ORTHOPAEDIC CONSULTATION  REQUESTING PHYSICIAN: Enid Baas, MD  Chief Complaint:   R hip pain  History of Present Illness: Ashley Mullins is a 84 y.o. female who had a fall at her assisted living facility yesterday.  The patient noted immediate hip pain and inability to ambulate.  The patient ambulates with a walker at baseline.  Pain is described as sharp at its worst and a dull ache at its best.  Pain is rated a 10 out of 10 in severity.  Pain is improved with rest and immobilization.  Pain is worse with any sort of movement.  X-rays in the emergency department show a right femoral neck and intertrochanteric hip fracture.  Of note, patient has a history of delusional dementia. Patient able to recall history. History confirmed by discussing with ED staff and review of records. Patient is on Coumadin for a-fib.   Past Medical History:  Diagnosis Date  . A-fib (HCC)   . CAD (coronary artery disease)   . Dementia   . Depression   . Hyperlipidemia   . Irregular heart beat   . Pacemaker    Past Surgical History:  Procedure Laterality Date  . BACK SURGERY    . PACEMAKER INSERTION    . VAGINAL HYSTERECTOMY     Social History   Socioeconomic History  . Marital status: Widowed    Spouse name: Not on file  . Number of children: Not on file  . Years of education: Not on file  . Highest education level: Not on file  Occupational History  . Not on file  Social Needs  . Financial resource strain: Not on file  . Food insecurity:    Worry: Not on file    Inability: Not on file  . Transportation needs:    Medical: Not on file    Non-medical: Not on file  Tobacco Use  . Smoking status: Former Games developer  . Smokeless tobacco: Never Used  Substance and Sexual Activity  . Alcohol use: No  . Drug use: No  . Sexual activity: Not on file  Lifestyle  . Physical activity:    Days per week: Not on file   Minutes per session: Not on file  . Stress: Not on file  Relationships  . Social connections:    Talks on phone: Not on file    Gets together: Not on file    Attends religious service: Not on file    Active member of club or organization: Not on file    Attends meetings of clubs or organizations: Not on file    Relationship status: Not on file  Other Topics Concern  . Not on file  Social History Narrative  . Not on file   History reviewed. No pertinent family history. Allergies  Allergen Reactions  . Chicken Allergy   . Codeine   . Other     Pt is allergic to [place] and chicken   Prior to Admission medications   Medication Sig Start Date End Date Taking? Authorizing Provider  acetaminophen (TYLENOL) 650 MG CR tablet Take 650 mg by mouth every 8 (eight) hours.   Yes [provider]  Amino Acids-Protein Hydrolys (FEEDING SUPPLEMENT, PRO-STAT SUGAR FREE 64,) LIQD Take 15 mLs by mouth 2 (two) times daily.   Yes [provider]  amLODipine (NORVASC) 5 MG tablet Take 5 mg by mouth daily. 08/05/16  Yes [provider]  atorvastatin (LIPITOR) 80 MG tablet Take 80 mg by mouth daily. 08/28/16  Yes  [provider]  ferrous sulfate 325 (65 FE) MG tablet Take 325 mg by mouth daily with breakfast.   Yes [provider]  furosemide (LASIX) 40 MG tablet Take 40 mg by mouth daily.   Yes [provider]  metoprolol (LOPRESSOR) 50 MG tablet Take 50 mg by mouth 2 (two) times daily.  07/15/16  Yes [provider]  nitroGLYCERIN (NITROSTAT) 0.4 MG SL tablet Place 0.4 mg under the tongue every 5 (five) minutes as needed for chest pain.   Yes [provider]  potassium chloride (K-DUR,KLOR-CON) 10 MEQ tablet Take 10 mEq by mouth daily.   Yes [provider]  QUEtiapine (SEROQUEL) 50 MG tablet Take 1 tablet (50 mg total) by mouth at bedtime. Patient taking differently: Take 50-150 mg by mouth daily. Take 50 mg by mouth in the  morning and 150 mg by mouth at bedtime. 03/26/17  Yes Sharman Cheek, MD  warfarin (COUMADIN) 1 MG tablet Take 1 mg by mouth daily.   Yes [provider]  memantine (NAMENDA) 5 MG tablet Take 1 tablet (5 mg total) by mouth daily. Patient not taking: Reported on 02/07/2018 01/05/17   Arnaldo Natal, MD  OLANZapine (ZYPREXA) 5 MG tablet Take 0.5 tablets (2.5 mg total) by mouth 3 (three) times daily with meals. 01/05/17 01/05/18  Arnaldo Natal, MD  warfarin (COUMADIN) 2 MG tablet Take 1.5 tablets (3 mg total) by mouth daily. Patient not taking: Reported on 02/07/2018 01/05/17   Loleta Rose, MD   Recent Labs    02/07/18 1855  02/08/18 0410 02/08/18 0603 02/08/18 0903  WBC 7.7  --  8.0  --   --   HGB 12.4  --  11.1*  --   --   HCT 36.0  --  32.8*  --   --   PLT 265  --  249  --   --   K 3.8  --  4.0  --   --   CL 101  --  100  --   --   CO2 27  --  27  --   --   BUN 33*  --  32*  --   --   CREATININE 1.51*  --  1.53*  --   --   GLUCOSE 169*  --  135*  --   --   CALCIUM 8.2*  --  8.3*  --   --   INR 2.64   < >  --  1.56 1.44   < > = values in this interval not displayed.   Dg Chest Portable 1 View  Result Date: 02/07/2018 CLINICAL DATA:  Status post fall.  Preop evaluation. EXAM: PORTABLE CHEST 1 VIEW COMPARISON:  Chest x-ray dated 09/14/2017. FINDINGS: Stable mild cardiomegaly. Prominent mitral annulus calcifications. Atherosclerotic calcifications noted at the aortic arch. LEFT chest wall pacemaker leads appear stable in position. Lungs are clear. Osseous structures about the chest are unremarkable. IMPRESSION: 1. No active disease. No evidence of pneumonia or pulmonary edema. Stable mild cardiomegaly. 2.  Aortic Atherosclerosis (ICD10-I70.0). Electronically Signed   By: Bary Richard M.D.   On: 02/07/2018 19:27   Dg Hip Port Unilat With Pelvis 1v Right  Result Date: 02/08/2018 CLINICAL DATA:  Known right proximal femoral fracture EXAM: DG HIP (WITH OR WITHOUT PELVIS) 1V PORT  RIGHT COMPARISON:  02/07/2018 FINDINGS: Single frontal view of the right hip reveals the known proximal right femoral fracture with both subcapital and intratrochanteric components. Increase in the  degree of impaction is noted when compared with the prior exam. IMPRESSION: Slight increase in impaction at the fracture site in the proximal right femur. Electronically Signed   By: Alcide Clever M.D.   On: 02/08/2018 08:07   Dg Hip Unilat  With Pelvis 2-3 Views Right  Result Date: 02/07/2018 CLINICAL DATA:  Fall. EXAM: DG HIP (WITH OR WITHOUT PELVIS) 2-3V RIGHT COMPARISON:  None. FINDINGS: Markedly displaced/comminuted fracture of the RIGHT femoral neck, subcapital and intertrochanteric distribution, with approximately 90 degrees angulation deformity at the main fracture site. Femoral head remains appropriately positioned relative to the acetabulum. No additional fracture seen within the osseous pelvis, although diffuse osteopenia limits characterization of osseous detail. IMPRESSION: Markedly displaced/comminuted fracture of the RIGHT femoral neck, subcapital and intertrochanteric distribution, with approximately 90 degrees angulation deformity at the main fracture site. Electronically Signed   By: Bary Richard M.D.   On: 02/07/2018 19:26   Dg Femur, Min 2 Views Right  Result Date: 02/07/2018 CLINICAL DATA:  Fall. EXAM: RIGHT FEMUR 2 VIEWS COMPARISON:  None. FINDINGS: There is a comminuted and displaced right femoral intertrochanteric fracture. Severe varus angulation. No subluxation or dislocation. No additional femoral abnormality. IMPRESSION: Severely angulated and displaced right femoral intertrochanteric fracture. Electronically Signed   By: Charlett Nose M.D.   On: 02/07/2018 20:24     Positive ROS: All other systems have been reviewed and were otherwise negative with the exception of those mentioned in the HPI and as above.  Physical Exam: BP (!) 124/59   Pulse 63   Temp 97.6 F (36.4 C)  (Tympanic)   Resp 14   Ht 5\' 6"  (1.676 m)   Wt 59 kg   SpO2 98%   BMI 20.98 kg/m  General:  Alert, no acute distress Psychiatric:  Patient is NOT competent for consent. Normal mood and affect, no agitation Cardiovascular:  No pedal edema, regular rate and rhythm Respiratory:  No wheezing, non-labored breathing GI:  Abdomen is soft and non-tender Skin:  No lesions in the area of chief complaint, no erythema Neurologic:  Sensation intact distally, CN grossly intact Lymphatic:  No axillary or cervical lymphadenopathy  Orthopedic Exam:  RLE: 5/5 DF/PF/EHL SILT s/s/t/sp/dp distr Foot wwp +Log roll/axial load   X-rays:  As above: R intertrochanteric hip fracture with femoral neck fracture, there is significant displacement of the greater trochanter  Assessment/Plan: Ashley Mullins is a 84 y.o. female with a R femoral neck + intertrochanteric hip fracture with significant greater trochanter displacement   1. I discussed the various treatment options including both surgical and non-surgical management of her fracture with the patient's daughter (medical PoA). We discussed the high risk of perioperative complications due to patient's age, dementia, and other co-morbidities. After discussion of risks, benefits, and alternatives to surgery, the family and patient were in agreement to proceed with surgery. The goals of surgery would be to provide adequate pain relief and allow for early mobilization. Plan for surgery is R hip cephalomedullary nailing today, 02/08/2018. We did discuss that there was a signoficant chance of nonunion/malunion given that there was a femoral neck fracture, but given the fracture pattern, an arthroplasty option would be much more complicated than a standard hemiarthroplasty or total hip arthroplasty given the lack of proximal stem fixation.  2. NPO until OR 3. Hold anticoagulation in advance of OR 4. IV vitamin K was given and INR has been reversed to 1.4 5.  Patient medically optimized per Cardiology and primary medical teams.  Signa KellSunny Alison Kubicki   02/08/2018 10:16 AM

## 2018-02-08 NOTE — Progress Notes (Signed)
BP 82/44 and HR 78, IV fluids running at 75cc/hr. Notified MD. New orders for a 250cc NS bolus for 2 hours.

## 2018-02-08 NOTE — Clinical Social Work Note (Signed)
Clinical Social Work Assessment  Patient Details  Name: Ashley Mullins MRN: [id number] Date of Birth: 08/25/1928  Date of referral:  02/08/18               Reason for consult:  Facility Placement                Permission sought to share information with:  Oceanographeracility Contact Representative Permission granted to share information::  Yes, Verbal Permission Granted  Name::      Skilled Nursing Facility   Agency::   [place]   Relationship::     Contact Information:     Housing/Transportation Living arrangements for the past 2 months:  Assisted Living Facility Source of Information:  Adult Children, Guardian Patient Interpreter Needed:  None Criminal Activity/Legal Involvement Pertinent to Current Situation/Hospitalization:  No - Comment as needed Significant Relationships:  Adult Children Lives with:  Facility Resident Do you feel safe going back to the place where you live?  Yes Need for family participation in patient care:  Yes (Comment)  Care giving concerns:  [name].    Social Worker assessment / plan:  Visual merchandiserClinical Social Worker (CSW) received SNF consult. Patient had surgery today and PT is pending. CSW contacted patient's daughter Ashley Mullins because patient was out of the room. Per daughter she is patient's guardian and will bring guardianship paper work [facility]. Daughter reported that patient has been at [facility] since Feb. 2019. CSW explained that PT will evaluate patient and she will likely need to go to SNF for rehab. CSW explained that medicare requires a 3 night qualifying inpatient stay in a hospital in order for pay for SNF. Patient was admitted to inpatient on 02/07/18. Daughter verbalized her understanding and is agreeable to [place] search [place]. Daughter requested [facility]. FL2 complete and faxed out. CSW will continue to follow and assist as needed.   Employment status:  Disabled (Comment on whether or not currently  receiving Disability), Retired Health and safety inspectornsurance information:  Medicare PT Recommendations:  Not assessed at this time Information / Referral to community resources:  Skilled Nursing Facility  Patient/Family's Response to care:  Patient's [facility]able [facility] [place].   Patient/Family's Understanding of and Emotional Response to Diagnosis, Current Treatment, and Prognosis:  Patient's daughter was very pleasant and thanked CSW for assistance.   Emotional Assessment Appearance:  Appears stated age Attitude/Demeanor/Rapport:  Unable to Assess Affect (typically observed):  Unable to Assess Orientation:  Oriented to Self, Fluctuating Orientation (Suspected and/or reported Sundowners) Alcohol / Substance use:  Not Applicable Psych involvement (Current and /or in the community):  No (Comment)  Discharge Needs  Concerns to be addressed:  Discharge Planning Concerns Readmission within the last 30 days:  No Current discharge risk:  Dependent with Mobility Barriers to Discharge:  Continued Medical Work up   [employer], Ashley CrockerBailey M, LCSW 02/08/2018, 3:33 PM

## 2018-02-08 NOTE — Progress Notes (Signed)
[facility] [place] [facility]   PATIENT NAME: Ashley Mullins    MR#:  [id number]  DATE OF BIRTH:  16-Mar-1929  SUBJECTIVE:  CHIEF COMPLAINT:   Chief Complaint  Patient presents with  . Fall  . Leg Pain   -Patient seen this morning.  Status post mechanical fall and right hip fracture.  Going for surgery today.  Daughter at bedside  REVIEW OF SYSTEMS:  Review of Systems  Constitutional: Negative for chills and fever.  HENT: Negative for congestion, ear discharge, hearing loss and nosebleeds.        Dry mouth  Eyes: Negative for blurred vision and double vision.  Respiratory: Negative for cough, shortness of breath and wheezing.   Cardiovascular: Negative for chest pain and palpitations.  Gastrointestinal: Negative for abdominal pain, constipation, diarrhea, nausea and vomiting.  Genitourinary: Negative for dysuria.  Musculoskeletal: Positive for myalgias.  Neurological: Negative for dizziness, seizures and headaches.    DRUG ALLERGIES:   Allergies  Allergen Reactions  . Chicken Allergy   . Codeine   . Other     Pt is allergic to [place] and chicken    VITALS:  Blood pressure (!) 101/52, pulse 73, temperature 97.8 F (36.6 C), temperature source Oral, resp. rate 16, height 5\' 6"  (1.676 m), weight 59 kg, SpO2 100 %.  PHYSICAL EXAMINATION:  Physical Exam  GENERAL:  84 y.o.-year-old patient lying in the bed with no acute distress.  EYES: Pupils equal, round, reactive to light and accommodation. No scleral icterus. Extraocular muscles intact.  HEENT: Head atraumatic, normocephalic. Oropharynx and nasopharynx clear.  Dry mucous membranes noted NECK:  Supple, no jugular venous distention. No thyroid enlargement, no tenderness.  LUNGS: Normal breath sounds bilaterally, no wheezing, rales,rhonchi or crepitation. No use of accessory muscles of respiration.  Decreased bibasilar breath sounds CARDIOVASCULAR: S1, S2 normal. No rubs, or gallops.  Loud 3/6  systolic murmur is present ABDOMEN: Soft, nontender, nondistended. Bowel sounds present. No organomegaly or mass.  EXTREMITIES: No pedal edema, cyanosis, or clubbing.  Right leg is abducted and externally rotated NEUROLOGIC: Cranial nerves II through XII are intact. Muscle strength 5/5 in all extremities. Sensation intact. Gait not checked.  PSYCHIATRIC: The patient is alert and oriented x 3.  SKIN: No obvious rash, lesion, or ulcer.    LABORATORY PANEL:   CBC Recent Labs  Lab 02/08/18 0410  WBC 8.0  HGB 11.1*  HCT 32.8*  PLT 249   ------------------------------------------------------------------------------------------------------------------  Chemistries  Recent Labs  Lab 02/08/18 0410  NA 138  K 4.0  CL 100  CO2 27  GLUCOSE 135*  BUN 32*  CREATININE 1.53*  CALCIUM 8.3*   ------------------------------------------------------------------------------------------------------------------  Cardiac Enzymes No results for input(s): TROPONINI in the last 168 hours. ------------------------------------------------------------------------------------------------------------------  RADIOLOGY:  Dg Chest Portable 1 View  Result Date: 02/07/2018 CLINICAL DATA:  Status post fall.  Preop evaluation. EXAM: PORTABLE CHEST 1 VIEW COMPARISON:  Chest x-ray dated 09/14/2017. FINDINGS: Stable mild cardiomegaly. Prominent mitral annulus calcifications. Atherosclerotic calcifications noted at the aortic arch. LEFT chest wall pacemaker leads appear stable in position. Lungs are clear. Osseous structures about the chest are unremarkable. IMPRESSION: 1. No active disease. No evidence of pneumonia or pulmonary edema. Stable mild cardiomegaly. 2.  Aortic Atherosclerosis (ICD10-I70.0). Electronically Signed   By: Bary Richard M.D.   On: 02/07/2018 19:27   Dg Hip Port Unilat With Pelvis 1v Right  Result Date: 02/08/2018 CLINICAL DATA:  Known right proximal femoral fracture EXAM: DG HIP (WITH  OR  WITHOUT PELVIS) 1V PORT RIGHT COMPARISON:  02/07/2018 FINDINGS: Single frontal view of the right hip reveals the known proximal right femoral fracture with both subcapital and intratrochanteric components. Increase in the degree of impaction is noted when compared with the prior exam. IMPRESSION: Slight increase in impaction at the fracture site in the proximal right femur. Electronically Signed   By: Alcide Clever M.D.   On: 02/08/2018 08:07   Dg Hip Operative Unilat W Or W/o Pelvis Right  Result Date: 02/08/2018 CLINICAL DATA:  Status post ORIF for a comminuted subcapital, neck, and intertrochanteric fracture of the right hip. EXAM: OPERATIVE right HIP (WITH PELVIS IF PERFORMED) 4 VIEWS TECHNIQUE: Fluoroscopic spot image(s) were submitted for interpretation post-operatively. COMPARISON:  Preoperative study of today's date FINDINGS: Reported fluoro time is 1 minutes, 59 seconds. The patient has undergone placement of an intramedullary rod and cortical screws for fixation of the intertrochanteric fracture of the right hip. Alignment is now near anatomic. No immediate postprocedure complication is observed. IMPRESSION: Status post ORIF for an intertrochanteric fracture of the right femur. Alignment is now near anatomic. Electronically Signed   By: David  [place] M.D.   On: 02/08/2018 13:00   Dg Hip Unilat  With Pelvis 2-3 Views Right  Result Date: 02/07/2018 CLINICAL DATA:  Fall. EXAM: DG HIP (WITH OR WITHOUT PELVIS) 2-3V RIGHT COMPARISON:  None. FINDINGS: Markedly displaced/comminuted fracture of the RIGHT femoral neck, subcapital and intertrochanteric distribution, with approximately 90 degrees angulation deformity at the main fracture site. Femoral head remains appropriately positioned relative to the acetabulum. No additional fracture seen within the osseous pelvis, although diffuse osteopenia limits characterization of osseous detail. IMPRESSION: Markedly displaced/comminuted fracture of the RIGHT femoral  neck, subcapital and intertrochanteric distribution, with approximately 90 degrees angulation deformity at the main fracture site. Electronically Signed   By: Bary Richard M.D.   On: 02/07/2018 19:26   Dg Femur, Min 2 Views Right  Result Date: 02/07/2018 CLINICAL DATA:  Fall. EXAM: RIGHT FEMUR 2 VIEWS COMPARISON:  None. FINDINGS: There is a comminuted and displaced right femoral intertrochanteric fracture. Severe varus angulation. No subluxation or dislocation. No additional femoral abnormality. IMPRESSION: Severely angulated and displaced right femoral intertrochanteric fracture. Electronically Signed   By: Charlett Nose M.D.   On: 02/07/2018 20:24    EKG:   Orders placed or performed during the hospital encounter of 02/07/18  . EKG 12-Lead  . EKG 12-Lead  . ED EKG  . ED EKG  . EKG    ASSESSMENT AND PLAN:   84 year old female with past medical history significant for history of complete heart block status post dual-chamber pacemaker, dementia, CAD, history of A. fib on warfarin, hyperlipidemia presents to hospital secondary to mechanical fall and right femoral neck and intertrochanteric fracture.  1.  Right hip fracture-x-rays with right femoral neck and intertrochanteric hip fracture.  Appreciate Ortho consult -For surgery today.  Received vitamin K and Coumadin has been held.  INR less than 1.5 -Appreciate cardiology input to clear the patient for surgery.-Monitor postop anemia and delirium.  Especially with her underlying history of dementia. -Pain control and physical therapy consult as well.  2.  Atrial fibrillation, history of complete heart block-status post pacemaker.  Appreciate cardiology consult.  Recently interrogated and everything was normal. -Heart rate better controlled. -Continue metoprolol.  Coumadin has been held, can be restarted after surgery.  No need for bridging with therapeutic dose of Lovenox.  3.  Hypertension-on Norvasc  4.  Depression and  dementia-continue home medications.  Pleasantly confused, seems to be at baseline.  Monitor while on pain medications  5.  DVT prophylaxis-on Lovenox Coumadin will be restarted today   All the records are reviewed and case discussed with Care Management/Social Workerr. Management plans discussed with the patient, family and they are in agreement.  CODE STATUS: Full code  TOTAL TIME TAKING CARE OF THIS PATIENT: 39 minutes.   POSSIBLE D/C IN 2-3 DAYS, DEPENDING ON CLINICAL CONDITION.   Tyleigh Mahn M.D on 02/08/2018 at 3:05 PM  Between 7am to 6pm - Pager - [phone]  After 6pm go to [email or web address] - Social research officer, governmentpassword EPAS [facility]  [facility]  Office  [phone]  CC: Primary care physician; Marcina MillardParaschos, Alexander, MD

## 2018-02-08 NOTE — Progress Notes (Signed)
Patient is sent to OR via the room bed. NSL. External cath removed. Signed consent by daughter in chart.

## 2018-02-08 NOTE — Consult Note (Signed)
[facility] Cardiology  CARDIOLOGY CONSULT NOTE  Patient ID: Ashley Mullins MRN: [id number] DOB/AGE: 19-Feb-1929 84 y.o.  Admit date: 02/07/2018 Referring Physician Imogene Burn Primary Physician [facility] Primary Cardiologist Paraschos Reason for Consultation Preoperative cardiovascular evaluation  HPI: 84 year old female referred for preoperative cardiovascular evaluation prior to surgical repair of right hip fracture.  The patient has a history of paroxysmal atrial fibrillation on warfarin, essential hypertension, hyperlipidemia, dual-chamber pacemaker for complete heart block, and dementia.  The patient reportedly sustained a mechanical fall, fracturing her right hip yesterday, and was brought to [facility] ER for further evaluation.  Admission labs notable for creatinine 1.51, BUN 33, GFR 30, INR 2.64, hemoglobin 11.1, and hematocrit 32.8.  The patient was given vitamin K, and surgery was scheduled for this morning, 02/08/2018.  Prior to her fall, the patient reports feeling well overall. Pacemaker interrogation on 08/27/2017 revealed normal dual-chamber pacemaker function, with an estimated longevity of 5.5 years. 2D echocardiogram was performed at her facility per protocol on 11/19/2016, which revealed normal left ventricular function, with LVEF greater than 55%, with mild mitral and tricuspid regurgitation. She denies any recent episodes of chest pain, shortness of breath, peripheral edema, or palpitations.  She denies any pain at this time.  Review of systems complete and found to be negative unless listed above     Past Medical History:  Diagnosis Date  . A-fib (HCC)   . CAD (coronary artery disease)   . Dementia   . Depression   . Hyperlipidemia   . Irregular heart beat   . Pacemaker     Past Surgical History:  Procedure Laterality Date  . BACK SURGERY    . PACEMAKER INSERTION    . VAGINAL HYSTERECTOMY      Medications Prior to Admission  Medication Sig Dispense Refill Last Dose  .  acetaminophen (TYLENOL) 650 MG CR tablet Take 650 mg by mouth every 8 (eight) hours.   02/06/2018 at 1600  . Amino Acids-Protein Hydrolys (FEEDING SUPPLEMENT, PRO-STAT SUGAR FREE 64,) LIQD Take 15 mLs by mouth 2 (two) times daily.   02/06/2018 at 2000  . amLODipine (NORVASC) 5 MG tablet Take 5 mg by mouth daily.   02/07/2018 at 0800  . atorvastatin (LIPITOR) 80 MG tablet Take 80 mg by mouth daily.   02/06/2018 at 0800  . ferrous sulfate 325 (65 FE) MG tablet Take 325 mg by mouth daily with breakfast.   02/07/2018 at 0800  . furosemide (LASIX) 40 MG tablet Take 40 mg by mouth daily.   02/07/2018 at 0800  . metoprolol (LOPRESSOR) 50 MG tablet Take 50 mg by mouth 2 (two) times daily.    02/06/2018 at 2000  . nitroGLYCERIN (NITROSTAT) 0.4 MG SL tablet Place 0.4 mg under the tongue every 5 (five) minutes as needed for chest pain.   PRN at PRN  . potassium chloride (K-DUR,KLOR-CON) 10 MEQ tablet Take 10 mEq by mouth daily.   02/07/2018 at 0800  . QUEtiapine (SEROQUEL) 50 MG tablet Take 1 tablet (50 mg total) by mouth at bedtime. (Patient taking differently: Take 50-150 mg by mouth daily. Take 50 mg by mouth in the morning and 150 mg by mouth at bedtime.) 30 tablet 0 02/07/2018 at 0800  . warfarin (COUMADIN) 1 MG tablet Take 1 mg by mouth daily.   02/05/2018 at 1700  . memantine (NAMENDA) 5 MG tablet Take 1 tablet (5 mg total) by mouth daily. (Patient not taking: Reported on 02/07/2018) 30 tablet 0 Not Taking at Unknown time  .  OLANZapine (ZYPREXA) 5 MG tablet Take 0.5 tablets (2.5 mg total) by mouth 3 (three) times daily with meals. 30 tablet 2   . warfarin (COUMADIN) 2 MG tablet Take 1.5 tablets (3 mg total) by mouth daily. (Patient not taking: Reported on 02/07/2018) 45 tablet 3 Not Taking at Unknown time   Social History   Socioeconomic History  . Marital status: Widowed    Spouse name: Not on file  . Number of children: Not on file  . Years of education: Not on file  . Highest education level: Not on file   Occupational History  . Not on file  Social Needs  . Financial resource strain: Not on file  . Food insecurity:    Worry: Not on file    Inability: Not on file  . Transportation needs:    Medical: Not on file    Non-medical: Not on file  Tobacco Use  . Smoking status: Former Games developer  . Smokeless tobacco: Never Used  Substance and Sexual Activity  . Alcohol use: No  . Drug use: No  . Sexual activity: Not on file  Lifestyle  . Physical activity:    Days per week: Not on file    Minutes per session: Not on file  . Stress: Not on file  Relationships  . Social connections:    Talks on phone: Not on file    Gets together: Not on file    Attends religious service: Not on file    Active member of club or organization: Not on file    Attends meetings of clubs or organizations: Not on file    Relationship status: Not on file  . Intimate partner violence:    Fear of current or ex partner: Not on file    Emotionally abused: Not on file    Physically abused: Not on file    Forced sexual activity: Not on file  Other Topics Concern  . Not on file  Social History Narrative  . Not on file    History reviewed. No pertinent family history.    Review of systems complete and found to be negative unless listed above      PHYSICAL EXAM  General: Elderly female, lying supine in bed, in no acute distress HEENT:  Normocephalic and atramatic Neck:  No JVD.  Lungs: Normal effort of breathing on room air, no accessory respiratory muscle use. Heart: HRRR .  1-2/6 systolic murmur Abdomen: Bowel sounds are positive, abdomen soft Msk: Gait not assessed.  Extremities: No clubbing, cyanosis or edema.   Neuro: Alert and oriented X 3. Psych:  Good affect, responds appropriately  Labs:   Lab Results  Component Value Date   WBC 8.0 02/08/2018   HGB 11.1 (L) 02/08/2018   HCT 32.8 (L) 02/08/2018   MCV 87.5 02/08/2018   PLT 249 02/08/2018    Recent Labs  Lab 02/08/18 0410  NA 138  K  4.0  CL 100  CO2 27  BUN 32*  CREATININE 1.53*  CALCIUM 8.3*  GLUCOSE 135*   Lab Results  Component Value Date   TROPONINI <0.03 09/14/2017   No results found for: CHOL No results found for: HDL No results found for: LDLCALC No results found for: TRIG No results found for: CHOLHDL No results found for: LDLDIRECT    Radiology: Dg Chest Portable 1 View  Result Date: 02/07/2018 CLINICAL DATA:  Status post fall.  Preop evaluation. EXAM: PORTABLE CHEST 1 VIEW COMPARISON:  Chest x-ray dated 09/14/2017. FINDINGS:  Stable mild cardiomegaly. Prominent mitral annulus calcifications. Atherosclerotic calcifications noted at the aortic arch. LEFT chest wall pacemaker leads appear stable in position. Lungs are clear. Osseous structures about the chest are unremarkable. IMPRESSION: 1. No active disease. No evidence of pneumonia or pulmonary edema. Stable mild cardiomegaly. 2.  Aortic Atherosclerosis (ICD10-I70.0). Electronically Signed   By: Bary RichardStan  Maynard M.D.   On: 02/07/2018 19:27   Dg Hip Unilat  With Pelvis 2-3 Views Right  Result Date: 02/07/2018 CLINICAL DATA:  Fall. EXAM: DG HIP (WITH OR WITHOUT PELVIS) 2-3V RIGHT COMPARISON:  None. FINDINGS: Markedly displaced/comminuted fracture of the RIGHT femoral neck, subcapital and intertrochanteric distribution, with approximately 90 degrees angulation deformity at the main fracture site. Femoral head remains appropriately positioned relative to the acetabulum. No additional fracture seen within the osseous pelvis, although diffuse osteopenia limits characterization of osseous detail. IMPRESSION: Markedly displaced/comminuted fracture of the RIGHT femoral neck, subcapital and intertrochanteric distribution, with approximately 90 degrees angulation deformity at the main fracture site. Electronically Signed   By: Bary RichardStan  Maynard M.D.   On: 02/07/2018 19:26   Dg Femur, Min 2 Views Right  Result Date: 02/07/2018 CLINICAL DATA:  Fall. EXAM: RIGHT FEMUR 2 VIEWS  COMPARISON:  None. FINDINGS: There is a comminuted and displaced right femoral intertrochanteric fracture. Severe varus angulation. No subluxation or dislocation. No additional femoral abnormality. IMPRESSION: Severely angulated and displaced right femoral intertrochanteric fracture. Electronically Signed   By: Charlett NoseKevin  Dover M.D.   On: 02/07/2018 20:24    ASSESSMENT AND PLAN:  1. Preoperative cardiovascular evaluation prior to surgical repair of right hip fracture. The patient has been medically optimized on warfarin, metoprolol, and atorvastatin, without complaints of chest pain, shortness of breath, palpitations, or peripheral edema. Recent 2D echocardiogram on 11/2017 revealed normal left ventricular function with mild mitral and tricuspid regurgitation.  2. Paroxysmal atrial fibrillation, rate controlled, holding warfarin. INR 1.56 this morning 3. Coronary artery disease, with myocardial infarction in the distant past, without PCI, currently without chest pain, with normal LV function. 4.  Dual-chamber pacemaker for CHB, last interrogation in 08/2017 revealed normal pacemaker function  Plan: 1. Proceed with surgical repair of right hip fracture if deemed necessary. The patient has been medically optimized. 2. Recommend continuing metoprolol pre-, peri-, and postoperatively. 3. Resume warfarin as soon as safely possible with a goal INR of 2.0-3.0. 4. No further cardiac diagnostics recommended at this time.  Signed: Leanora Ivanoffnna Steffanie Mingle PA-C 02/08/2018, 8:07 AM

## 2018-02-08 NOTE — Progress Notes (Signed)
Clinical Child psychotherapistocial Worker (CSW) presented bed offers to patient's daughter [name]. She chose [facility]. Tammy admissions coordinator at [facility] is aware of above.   Baker Hughes IncorporatedBailey Melissaann Dizdarevic, LCSW [phone]

## 2018-02-08 NOTE — [place] FL2 (Signed)
[place] MEDICAID FL2 LEVEL OF CARE SCREENING TOOL     IDENTIFICATION  Patient Name: Weston Brassellie Geraldine Casebier Birthdate: 03/03/1929 Sex: female Admission Date (Current Location): 02/07/2018  [place]   Facility and Address:  [facility], [address], [place], [place] [address]      Provider Number: [id number]  Attending Physician Name and Address:  Enid BaasKalisetti, Radhika, MD  Relative Name and Phone Number:       Current Level of Care: Hospital Recommended Level of Care: Skilled Nursing Facility Prior Approval Number:    Date Approved/Denied:   PASRR Number: [id number] [id number])  Discharge Plan: SNF    Current Diagnoses: Patient Active Problem List   Diagnosis Date Noted  . Closed right hip fracture (HCC) 02/07/2018  . Dementia with psychosis 08/29/2016    Orientation RESPIRATION BLADDER Height & Weight     Self  O2(2 Liters Oxygen. ) Continent Weight: 130 lb (59 kg) Height:  5\' 6"  (167.6 cm)  BEHAVIORAL SYMPTOMS/MOOD NEUROLOGICAL BOWEL NUTRITION STATUS      Continent Diet(Diet: Regular )  AMBULATORY STATUS COMMUNICATION OF NEEDS Skin   Extensive Assist Verbally Surgical wounds(Incision: Right Hip. )                       Personal Care Assistance Level of Assistance  Bathing, Feeding, Dressing Bathing Assistance: Limited assistance Feeding assistance: Independent Dressing Assistance: Limited assistance     Functional Limitations Info  Sight, Hearing, Speech Sight Info: Adequate Hearing Info: Adequate Speech Info: Adequate    SPECIAL CARE FACTORS FREQUENCY  PT (By licensed PT), OT (By licensed OT)     PT Frequency: (5) OT Frequency: (5)            Contractures      Additional Factors Info  Code Status, Allergies Code Status Info: (Full Code. ) Allergies Info: (Chicken Allergy, Codeine, Other)           Current Medications (02/08/2018):  This is the current hospital active medication  list Current Facility-Administered Medications  Medication Dose Route Frequency Provider Last Rate Last Dose  . 0.9 %  sodium chloride infusion   Intravenous Continuous Signa KellPatel, Sunny, MD      . acetaminophen (TYLENOL) tablet 1,000 mg  1,000 mg Oral Q8H Signa KellPatel, Sunny, MD      . albuterol (PROVENTIL) (2.5 MG/3ML) 0.083% nebulizer solution 2.5 mg  2.5 mg Nebulization Q2H PRN Signa KellPatel, Sunny, MD      . amLODipine (NORVASC) tablet 5 mg  5 mg Oral Daily Signa KellPatel, Sunny, MD      . atorvastatin (LIPITOR) tablet 80 mg  80 mg Oral Daily Signa KellPatel, Sunny, MD   80 mg at 02/07/18 2159  . bisacodyl (DULCOLAX) suppository 10 mg  10 mg Rectal Daily PRN Signa KellPatel, Sunny, MD      . ceFAZolin (ANCEF) IVPB 1 g/50 mL premix  1 g Intravenous Q6H Signa KellPatel, Sunny, MD      . docusate sodium (COLACE) capsule 100 mg  100 mg Oral BID Signa KellPatel, Sunny, MD      . Melene Muller[START ON 02/09/2018] enoxaparin (LOVENOX) injection 30 mg  30 mg Subcutaneous Q24H Signa KellPatel, Sunny, MD      . feeding supplement (PRO-STAT SUGAR FREE 64) liquid 15 mL  15 mL Oral BID Signa KellPatel, Sunny, MD      . ferrous sulfate tablet 325 mg  325 mg Oral Q breakfast Signa KellPatel, Sunny, MD      . HYDROmorphone (DILAUDID) injection 0.25-0.5  mg  0.25-0.5 mg Intravenous Q2H PRN Signa Kell, MD      . methocarbamol (ROBAXIN) tablet 500 mg  500 mg Oral Q6H PRN Signa Kell, MD       Or  . methocarbamol (ROBAXIN) 500 mg in dextrose 5 % 50 mL IVPB  500 mg Intravenous Q6H PRN Signa Kell, MD      . metoCLOPramide (REGLAN) tablet 5-10 mg  5-10 mg Oral Q8H PRN Signa Kell, MD       Or  . metoCLOPramide (REGLAN) injection 5-10 mg  5-10 mg Intravenous Q8H PRN Signa Kell, MD      . metoprolol tartrate (LOPRESSOR) tablet 50 mg  50 mg Oral BID Signa Kell, MD   50 mg at 02/08/18 0913  . nitroGLYCERIN (NITROSTAT) SL tablet 0.4 mg  0.4 mg Sublingual Q5 min PRN Signa Kell, MD      . OLANZapine [facility]) tablet 1.25 mg  1.25 mg Oral TID WC Signa Kell, MD      . ondansetron [facility]) tablet 4 mg  4 mg Oral Q6H  PRN Signa Kell, MD       Or  . ondansetron [facility]) injection 4 mg  4 mg Intravenous Q6H PRN Signa Kell, MD      . oxyCODONE (Oxy IR/ROXICODONE) immediate release tablet 2.5-5 mg  2.5-5 mg Oral Q3H PRN Signa Kell, MD      . oxyCODONE (Oxy IR/ROXICODONE) immediate release tablet 5-10 mg  5-10 mg Oral Q4H PRN Signa Kell, MD      . potassium chloride (K-DUR,KLOR-CON) CR tablet 10 mEq  10 mEq Oral Daily Signa Kell, MD      . QUEtiapine (SEROQUEL) tablet 150 mg  150 mg Oral QHS Signa Kell, MD   150 mg at 02/07/18 2159  . QUEtiapine (SEROQUEL) tablet 50 mg  50 mg Oral Daily Signa Kell, MD      . senna-docusate (Senokot-S) tablet 1 tablet  1 tablet Oral QHS PRN Signa Kell, MD      . sodium phosphate (FLEET) 7-19 GM/118ML enema 1 enema  1 enema Rectal Once PRN Signa Kell, MD      . traMADol Janean Sark) tablet 50 mg  50 mg Oral Q6H PRN Signa Kell, MD      . warfarin (COUMADIN) tablet 2 mg  2 mg Oral ONCE-1800 Enid Baas, MD      . Warfarin - Pharmacist Dosing Inpatient   Does not apply [id number] Enid Baas, MD         Discharge Medications: Please see discharge summary for a list of discharge medications.  Relevant Imaging Results:  Relevant Lab Results:   Additional Information (SSN: [id number])  Dacen Frayre, Darleen Crocker, LCSW

## 2018-02-08 NOTE — Clinical Social Work Placement (Signed)
   CLINICAL SOCIAL WORK PLACEMENT  NOTE  Date:  02/08/2018  Patient Details  Name: Ashley Mullins MRN: [id number] Date of Birth: 06/08/1929  Clinical Social Work is seeking post-discharge placement for this patient at the Skilled  Nursing Facility level of care (*CSW will initial, date and re-position this form in  chart as items are completed):  Yes   Patient/family provided with [facility] Clinical Social Work Department's list of facilities offering this level of care within the geographic area requested by the patient (or if unable, by the patient's family).  Yes   Patient/family informed of their freedom to choose among providers that offer the needed level of care, that participate in Medicare, Medicaid or managed care program needed by the patient, have an available bed and are willing to accept the patient.  Yes   Patient/family informed of [facility]'s ownership interest [facility] and [facility], as well as of the fact that they are under no obligation to receive care at these facilities.  PASRR submitted to EDS on 02/08/18     PASRR number received on 02/08/18     Existing PASRR number confirmed on       FL2 transmitted to all facilities in geographic area requested by pt/family on 02/08/18     FL2 transmitted to all facilities within larger geographic area on       Patient informed that his/her managed care company has contracts with or will negotiate with certain facilities, including the following:            Patient/family informed of bed offers received.  Patient chooses bed at       Physician recommends and patient chooses bed at      Patient to be transferred to   on  .  Patient to be transferred to facility by       Patient family notified on   of transfer.  Name of family member notified:        PHYSICIAN       Additional Comment:    _______________________________________________ Kriti Katayama, Darleen CrockerBailey M, LCSW 02/08/2018, 3:32  PM

## 2018-02-08 NOTE — Progress Notes (Addendum)
Pt is confused. She pulled out staples out of the lower incision. Dr Allena KatzPatel notified. No new orders received.pt moved closer to nursing station

## 2018-02-08 NOTE — Progress Notes (Signed)
Patient returned from OR. Foley in place. Honeycomb dressings in place without drainage. Bedside report received.

## 2018-02-08 NOTE — Op Note (Signed)
DATE OF SURGERY: 02/08/2018  PREOPERATIVE DIAGNOSIS: Right intertrochanteric hip fracture and femoral neck fracture with large greater trochanteric fragment  POSTOPERATIVE DIAGNOSIS: Right intertrochanteric hip fracture with large greater trochanteric fragment  PROCEDURE: Intramedullary nailing of R femur with cephalomedullary device  SURGEON: Rosealee Albee, MD  ANESTHESIA: Gen  EBL: 200 cc  IVF: per anesthesia record  COMPONENTS:  [employer] Aff[employer]us Hip Fracture Nail: 11x377mm; 90mm lag screw; 75mm anti-rotation screw; 2 distal interolocking screws.   INDICATIONS: Ashley Mullins is a 84 y.o. female who sustained an intertrochanteric fracture with large greater trochanter fragment after a fall. The patient was on Coumadin and this was reversed with vitamin K. Risks and benefits of intramedullary nailing were explained to the patient and family (who were PoA). Risks include but are not limited to bleeding, infection, injury to tissues, nerves, vessels, nonunion/malunion, hardware failure, limb length discrepancy/hip rotation mismatch and risks of anesthesia. The patient and family understand these risks, have completed an informed consent, and wish to proceed.   PROCEDURE:  The patient was brought into the operating room. After administering anesthesia, the patient was placed in the supine position on the Hana table. The uninjured leg was placed in an extended position while the injured lower extremity was placed in longitudinal traction. The fracture was reduced using longitudinal traction with neutral rotation. The adequacy of reduction was verified fluoroscopically in AP and lateral projections and found to be acceptable. The lateral aspect of the right hip and thigh were prepped with ChloraPrep solution before being draped sterilely. Preoperative IV antibiotics were administered. A timeout was performed to verify the appropriate surgical site, patient, and procedure.    The greater  trochanter was identified and an approximately 6 cm incision was made about 3 fingerbreadths above the tip of the greater trochanter. The incision was carried down through the subcutaneous tissues to expose the gluteal fascia. This was split the length of the incision, providing access to the tip of the trochanter. Under fluoroscopic guidance, a guidewire was drilled through the tip of the trochanter into the proximal metaphysis to the level of the lesser trochanter. After verifying its position fluoroscopically in AP and lateral projections, it was overreamed with the opening reamer to the level of the lesser trochanter. A guidewire was passed down through the femoral canal to the supracondylar region. The adequacy of guidewire position was verified fluoroscopically in AP and lateral projections before the length of the guidewire within the canal was measured and a nail of appropriate length was selected. The guidewire was overreamed sequentially using the flexible reamers. The appropriate sized nail was selected and advanced to the appropriate depth as verified fluoroscopically.    The guide system for the lag screw was positioned and advanced through an approximately 4cm incision over the lateral aspect of the proximal femur. The guidewire was drilled up through the femoral nail and into the femoral neck to rest within 5 mm of subchondral bone. After verifying its position in the femoral neck and head in both AP and lateral projections, the guidewire was measured and appropriate sized lag screw was selected. The guidewire was overreamed to the appropriate depth before the lag screw was inserted and advanced to the appropriate depth as verified fluoroscopically in AP and lateral projections. The guide for the derotation screw was placed and this was drilled to the appropriate depth. The appropriate sized derotation screw was selected and advanced. The set screw was tightened and then untightened a quarter turn  to allow for  compression. Again, the adequacy of hardware position and fracture reduction was verified fluoroscopically in AP and lateral projections.   Attention was directed distally. Using the "perfect circle" technique, the leg and fluoroscopy machine were positioned appropriately. A 2cm stab incision was made over the skin and IT band at the appropriate point before the drill bit was advanced through the cortex and across the static hole of the nail. This was repeated for the oblong hole as well. Appropriate screw lengths were determined with a depth gauage. Two distal interlocking screws were placed. Again the adequacy of screw position was verified fluoroscopically in AP and lateral projections.   The wounds were irrigated thoroughly with sterile saline solution. Local anesthetic was injected into the wounds. Deep fascia was closed with 0-Vicryl. The subcutaneous tissues were closed using 2-0 Vicryl interrupted sutures. The skin was closed using staples. Sterile occlusive dressings were applied to all wounds. The patient was then transferred to the recovery room in satisfactory condition.   POSTOPERATIVE PLAN: The patient will be touchdown weight bearing on the operative extremity. Lovenox 40mg /day until INR is therapeutic. Coumadin to resume tonight. Perioperative IV antibiotics x 24 hours. PT/OT on POD#1.

## 2018-02-08 NOTE — Anesthesia Procedure Notes (Signed)
Procedure Name: Intubation Date/Time: 02/08/2018 10:59 AM Performed by: Bernardo Heater, CRNA Pre-anesthesia Checklist: Patient identified, Emergency Drugs available, Suction available and Patient being monitored Patient Re-evaluated:Patient Re-evaluated prior to induction Oxygen Delivery Method: Circle system utilized Preoxygenation: Pre-oxygenation with 100% oxygen Induction Type: IV induction Laryngoscope Size: Mac and 3 Grade View: Grade I Tube type: Oral Tube size: 7.0 mm Number of attempts: 1 Placement Confirmation: ETT inserted through vocal cords under direct vision,  positive ETCO2 and breath sounds checked- equal and bilateral Secured at: 20 cm Tube secured with: Tape Dental Injury: Teeth and Oropharynx as per pre-operative assessment

## 2018-02-08 NOTE — Progress Notes (Signed)
Anticoagulation monitoring(Lovenox):  84yo  F ordered Lovenox 40 mg Q24h  Filed Weights   02/07/18 1823 02/08/18 1004  Weight: 130 lb (59 kg) 130 lb (59 kg)   BMI 20.99   Lab Results  Component Value Date   CREATININE 1.53 (H) 02/08/2018   CREATININE 1.51 (H) 02/07/2018   CREATININE 1.39 (H) 09/14/2017   Estimated Creatinine Clearance: 24.1 mL/min (A) (by C-G formula based on SCr of 1.53 mg/dL (H)). Hemoglobin & Hematocrit     Component Value Date/Time   HGB 11.1 (L) 02/08/2018 0410   HCT 32.8 (L) 02/08/2018 0410     Per Protocol for Patient with estCrcl < 30 ml/min and BMI < 40, will transition to Lovenox 30 mg Q24h      Bari MantisKristin Leston Schueller PharmD Clinical Pharmacist 02/08/2018

## 2018-02-08 NOTE — Anesthesia Post-op Follow-up Note (Signed)
Anesthesia QCDR form completed.        

## 2018-02-08 NOTE — Progress Notes (Signed)
ANTICOAGULATION CONSULT NOTE  Pharmacy Consult for warfarin dosing  Indication: atrial fibrillation  Allergies  Allergen Reactions  . Chicken Allergy   . Codeine   . Other     Pt is allergic to [place] and chicken    Patient Measurements: Height: 5\' 6"  (167.6 cm) Weight: 130 lb (59 kg) IBW/kg (Calculated) : 59.3  Vital Signs: Temp: 97.8 F (36.6 C) (08/26 1430) Temp Source: Oral (08/26 1430) BP: 101/52 (08/26 1430) Pulse Rate: 73 (08/26 1430)  Labs: Recent Labs    02/07/18 1855 02/07/18 2224 02/08/18 0410 02/08/18 0603 02/08/18 0903  HGB 12.4  --  11.1*  --   --   HCT 36.0  --  32.8*  --   --   PLT 265  --  249  --   --   LABPROT 28.0* 24.7*  --  18.5* 17.4*  INR 2.64 2.25  --  1.56 1.44  CREATININE 1.51*  --  1.53*  --   --     Estimated Creatinine Clearance: 24.1 mL/min (A) (by C-G formula based on SCr of 1.53 mg/dL (H)).   Medications:  Scheduled:  . acetaminophen  1,000 mg Oral Q8H  . amLODipine  5 mg Oral Daily  . atorvastatin  80 mg Oral Daily  . docusate sodium  100 mg Oral BID  . [START ON 02/09/2018] enoxaparin (LOVENOX) injection  30 mg Subcutaneous Q24H  . feeding supplement (PRO-STAT SUGAR FREE 64)  15 mL Oral BID  . ferrous sulfate  325 mg Oral Q breakfast  . metoprolol tartrate  50 mg Oral BID  . OLANZapine  1.25 mg Oral TID WC  . potassium chloride  10 mEq Oral Daily  . QUEtiapine  150 mg Oral QHS  . QUEtiapine  50 mg Oral Daily  . warfarin  1 mg Oral Daily  . warfarin  3 mg Oral Daily   Infusions:  . sodium chloride    .  ceFAZolin (ANCEF) IV    . methocarbamol (ROBAXIN) IV      Assessment: Pharmacy consulted for warfarin dosing for 84 yo female on warfarin as an outpatient for atrial fibrillation, goal INR 2-3. Patient received Vitamin K 3mg  on 8/25 and vitamin K 1mg  on 8/26 in preparation for right hip cephalomedullary nailing. Patient initiated on enoxaparin 30mg  SQ Q24hr while INR is subtherapeutic.   Goal of Therapy:  INR  2-3 Monitor platelets by anticoagulation protocol: Yes   Plan:  Patient takes warfarin 3mg  Daily as an outpatient. Due to vitamin K exposure, will order warfarin 4mg  at 1800. Will obtain daily INRs and adjust as appropriate.   Pharmacy will continue to monitor and adjust per consult.    Ashley Mullins 02/08/2018,3:21 PM

## 2018-02-08 NOTE — Transfer of Care (Signed)
Immediate Anesthesia Transfer of Care Note  Patient: Ashley Mullins  Procedure(s) Performed: INTRAMEDULLARY (IM) NAIL INTERTROCHANTRIC (Right Hip)  Patient Location: PACU  Anesthesia Type:General  Level of Consciousness: sedated  Airway & Oxygen Therapy: Patient Spontanous Breathing and Patient connected to face mask oxygen  Post-op Assessment: Report given to RN and Post -op Vital signs reviewed and stable  Post vital signs: Reviewed and stable  Last Vitals:  Vitals Value Taken Time  BP 107/54 02/08/2018  1:22 PM  Temp    Pulse 66 02/08/2018  1:25 PM  Resp 20 02/08/2018  1:25 PM  SpO2 100 % 02/08/2018  1:25 PM  Vitals shown include unvalidated device data.  Last Pain:  Vitals:   02/08/18 1323  TempSrc:   PainSc: (P) Asleep         Complications: No apparent anesthesia complications

## 2018-02-08 NOTE — H&P (Signed)
H&P reviewed. No significant changes noted.  

## 2018-02-08 NOTE — Anesthesia Preprocedure Evaluation (Signed)
Anesthesia Evaluation  Patient identified by MRN, date of birth, ID band Patient confused    Reviewed: Allergy & Precautions, H&P , NPO status , Patient's Chart, lab work & pertinent test results  History of Anesthesia Complications Negative for: history of anesthetic complications  Airway Mallampati: III  TM Distance: <3 FB Neck ROM: full    Dental  (+) Chipped, Upper Dentures, Poor Dentition, Missing   Pulmonary neg shortness of breath, former smoker,           Cardiovascular Exercise Tolerance: Good (-) angina+ CAD and + Past MI  + pacemaker      Neuro/Psych PSYCHIATRIC DISORDERS Depression Dementia negative neurological ROS     GI/Hepatic negative GI ROS, Neg liver ROS, neg GERD  ,  Endo/Other  negative endocrine ROS  Renal/GU      Musculoskeletal   Abdominal   Peds  Hematology negative hematology ROS (+)   Anesthesia Other Findings Past Medical History: No date: A-fib (HCC) No date: CAD (coronary artery disease) No date: Dementia No date: Depression No date: Hyperlipidemia No date: Irregular heart beat No date: Pacemaker  Past Surgical History: No date: BACK SURGERY No date: PACEMAKER INSERTION No date: VAGINAL HYSTERECTOMY  BMI    Body Mass Index:  20.98 kg/m      Reproductive/Obstetrics negative OB ROS                             Anesthesia Physical Anesthesia Plan  ASA: III  Anesthesia Plan: General ETT   Post-op Pain Management:    Induction: Intravenous  PONV Risk Score and Plan: Ondansetron, Dexamethasone and Treatment may vary due to age or medical condition  Airway Management Planned: Oral ETT  Additional Equipment:   Intra-op Plan:   Post-operative Plan: Extubation in OR  Informed Consent: I have reviewed the patients History and Physical, chart, labs and discussed the procedure including the risks, benefits and alternatives for the proposed  anesthesia with the patient or authorized representative who has indicated his/her understanding and acceptance.   Dental Advisory Given  Plan Discussed with: Anesthesiologist, CRNA and Surgeon  Anesthesia Plan Comments: (Patient has cardiac clearance for this procedure.  Patient still anticoagulated so not a candidate for spinal at this time   Phone consent from daughter Brigitte Pulseeresa Richter at [phone]   Daughter informed that patient is higher risk for complications from anesthesia during this procedure due to their medical history and age including but not limited to post operative cognitive dysfunction.  She voiced understanding.  Patient consented for risks of anesthesia including but not limited to:  - adverse reactions to medications - damage to teeth, lips or other oral mucosa - sore throat or hoarseness - Damage to heart, brain, lungs or loss of life  Patient voiced understanding.)        Anesthesia Quick Evaluation

## 2018-02-09 ENCOUNTER — Encounter: Payer: Self-pay | Admitting: Orthopedic Surgery

## 2018-02-09 LAB — PREPARE RBC (CROSSMATCH)

## 2018-02-09 LAB — URINALYSIS, COMPLETE (UACMP) WITH MICROSCOPIC
BILIRUBIN URINE: NEGATIVE
Bacteria, UA: NONE SEEN
Glucose, UA: NEGATIVE mg/dL
KETONES UR: NEGATIVE mg/dL
Nitrite: NEGATIVE
PH: 5 (ref 5.0–8.0)
Protein, ur: NEGATIVE mg/dL
Specific Gravity, Urine: 1.021 (ref 1.005–1.030)

## 2018-02-09 LAB — COMPREHENSIVE METABOLIC PANEL
ALBUMIN: 2.5 g/dL — AB (ref 3.5–5.0)
ALT: 17 U/L (ref 0–44)
AST: 31 U/L (ref 15–41)
Alkaline Phosphatase: 149 U/L — ABNORMAL HIGH (ref 38–126)
Anion gap: 5 (ref 5–15)
BILIRUBIN TOTAL: 0.6 mg/dL (ref 0.3–1.2)
BUN: 33 mg/dL — ABNORMAL HIGH (ref 8–23)
CALCIUM: 7.9 mg/dL — AB (ref 8.9–10.3)
CO2: 26 mmol/L (ref 22–32)
Chloride: 108 mmol/L (ref 98–111)
Creatinine, Ser: 1.8 mg/dL — ABNORMAL HIGH (ref 0.44–1.00)
GFR calc non Af Amer: 24 mL/min — ABNORMAL LOW (ref >60)
GFR, EST AFRICAN AMERICAN: 28 mL/min — AB (ref >60)
Glucose, Bld: 152 mg/dL — ABNORMAL HIGH (ref 70–99)
POTASSIUM: 3.9 mmol/L (ref 3.5–5.1)
SODIUM: 139 mmol/L (ref 135–145)
TOTAL PROTEIN: 5.4 g/dL — AB (ref 6.5–8.1)

## 2018-02-09 LAB — CBC
HCT: 23.4 % — ABNORMAL LOW (ref 35.0–47.0)
HEMOGLOBIN: 7.9 g/dL — AB (ref 12.0–16.0)
MCH: 29.6 pg (ref 26.0–34.0)
MCHC: 33.6 g/dL (ref 32.0–36.0)
MCV: 88.1 fL (ref 80.0–100.0)
Platelets: 207 10*3/uL (ref 150–440)
RBC: 2.66 MIL/uL — AB (ref 3.80–5.20)
RDW: 15.7 % — ABNORMAL HIGH (ref 11.5–14.5)
WBC: 8.1 10*3/uL (ref 3.6–11.0)

## 2018-02-09 LAB — PROTIME-INR
INR: 1.34
PROTHROMBIN TIME: 16.5 s — AB (ref 11.4–15.2)

## 2018-02-09 LAB — ABO/RH: ABO/RH(D): O POS

## 2018-02-09 MED ORDER — SODIUM CHLORIDE 0.9% IV SOLUTION: Freq: Once | INTRAVENOUS | Status: DC

## 2018-02-09 MED ORDER — POLYETHYLENE GLYCOL 3350 17 G PO PACK
17.0000 g | PACK | Freq: Every day | ORAL | Status: DC
  Administered 2018-02-10: 17 g via ORAL
  Filled 2018-02-09: qty 1

## 2018-02-09 MED ORDER — HALOPERIDOL LACTATE 5 MG/ML IJ SOLN: 0.5000 mg | Freq: Four times a day (QID) | INTRAMUSCULAR | Status: DC | PRN

## 2018-02-09 MED ORDER — WARFARIN SODIUM 4 MG PO TABS
4.0000 mg | ORAL_TABLET | Freq: Once | ORAL | Status: DC
  Filled 2018-02-09: qty 1

## 2018-02-09 NOTE — Progress Notes (Signed)
  Subjective: 1 Day Post-Op Procedure(s) (LRB): INTRAMEDULLARY (IM) NAIL INTERTROCHANTRIC (Right) Patient reports pain as mild.   Patient seen in rounds with Dr. Allena KatzPatel. Patient is well, and has had no acute complaints or problems Plan is to go Skilled nursing facility after hospital stay. Negative for chest pain and shortness of breath Fever: no Gastrointestinal: Negative for nausea and vomiting  Objective: Vital signs in last 24 hours: Temp:  [96.7 F (35.9 C)-98.5 F (36.9 C)] 98.2 F (36.8 C) (08/27 0404) Pulse Rate:  [63-89] 88 (08/27 0404) Resp:  [14-18] 17 (08/27 0404) BP: (82-140)/(44-114) 140/62 (08/27 0404) SpO2:  [90 %-100 %] 98 % (08/27 0404) Weight:  [59 kg] 59 kg (08/26 1004)  Intake/Output from previous day:  Intake/Output Summary (Last 24 hours) at 02/09/2018 0613 Last data filed at 02/09/2018 0010 Gross per 24 hour  Intake 1507.7 ml  Output 550 ml  Net 957.7 ml    Intake/Output this shift: Total I/O In: -  Out: 100 [Urine:100]  Labs: Recent Labs    02/07/18 1855 02/08/18 0410 02/09/18 0426  HGB 12.4 11.1* 7.9*   Recent Labs    02/08/18 0410 02/09/18 0426  WBC 8.0 8.1  RBC 3.75* 2.66*  HCT 32.8* 23.4*  PLT 249 207   Recent Labs    02/08/18 0410 02/09/18 0426  NA 138 139  K 4.0 3.9  CL 100 108  CO2 27 26  BUN 32* 33*  CREATININE 1.53* 1.80*  GLUCOSE 135* 152*  CALCIUM 8.3* 7.9*   Recent Labs    02/08/18 0903 02/09/18 0426  INR 1.44 1.34     EXAM General - Patient is Confused and Delusional Extremity - Dorsiflexion/Plantar flexion intact No cellulitis present Dressing/Incision - clean, dry, Proximal wound has more dry serosanguineous drainage with no active bleeding drainage Motor Function - intact, moving foot and toes well on exam.   Past Medical History:  Diagnosis Date  . A-fib (HCC)   . CAD (coronary artery disease)   . Dementia   . Depression   . Hyperlipidemia   . Irregular heart beat   . Pacemaker      Assessment/Plan: 1 Day Post-Op Procedure(s) (LRB): INTRAMEDULLARY (IM) NAIL INTERTROCHANTRIC (Right) Active Problems:   Closed right hip fracture (HCC)  Estimated body mass index is 20.98 kg/m as calculated from the following:   Height as of this encounter: 5\' 6"  (1.676 m).   Weight as of this encounter: 59 kg. Advance diet Up with therapy D/C IV fluids Discharge to SNF when cleared by medicine. Transfuse 1 unit packed red blood cells this morning.  Recheck labs tomorrow.  DVT Prophylaxis - Lovenox, Foot Pumps and TED hose Weight-Bearing as tolerated to right leg  Dedra Skeensodd Melayna Robarts, PA-C Orthopaedic Surgery 02/09/2018, 6:13 AM

## 2018-02-09 NOTE — Anesthesia Postprocedure Evaluation (Signed)
Anesthesia Post Note  Patient: Ashley Mullins  Procedure(s) Performed: INTRAMEDULLARY (IM) NAIL INTERTROCHANTRIC (Right Hip)  Patient location during evaluation: PACU Anesthesia Type: General Level of consciousness: awake and alert Pain management: pain level controlled Vital Signs Assessment: post-procedure vital signs reviewed and stable Respiratory status: spontaneous breathing, nonlabored ventilation, respiratory function stable and patient connected to nasal cannula oxygen Cardiovascular status: blood pressure returned to baseline and stable Postop Assessment: no apparent nausea or vomiting Anesthetic complications: no     Last Vitals:  Vitals:   02/09/18 0945 02/09/18 1300  BP: (!) 110/44 (!) 108/45  Pulse: 68 80  Resp: 16 16  Temp: 36.9 C 36.9 C  SpO2: 98% 95%    Last Pain:  Vitals:   02/09/18 1300  TempSrc: Oral  PainSc:                  Cleda MccreedyJoseph K Kato Wieczorek

## 2018-02-09 NOTE — Progress Notes (Signed)
[facility] -  at [facility] [place]   PATIENT NAME: Ashley Mullins    MR#:  [id number]  DATE OF BIRTH:  10-19-1928  SUBJECTIVE:  CHIEF COMPLAINT:   Chief Complaint  Patient presents with  . Fall  . Leg Pain   -Postop day 1 after right hip fracture surgery.  Very confused and delirious -Hemoglobin drop noted, receiving 1 unit packed RBC transfusion  REVIEW OF SYSTEMS:  Review of Systems  Unable to provide any review of systems due to her confusion today    DRUG ALLERGIES:   Allergies  Allergen Reactions  . Chicken Allergy   . Codeine   . Other     Pt is allergic to [place] and chicken    VITALS:  Blood pressure (!) 110/44, pulse 68, temperature 98.5 F (36.9 C), temperature source Oral, resp. rate 16, height 5\' 6"  (1.676 m), weight 59 kg, SpO2 98 %.  PHYSICAL EXAMINATION:  Physical Exam  GENERAL:  84 y.o.-year-old patient lying in the bed with no acute distress.  EYES: Pupils equal, round, reactive to light and accommodation. No scleral icterus. Extraocular muscles intact.  HEENT: Head atraumatic, normocephalic. Oropharynx and nasopharynx clear.  Dry mucous membranes noted NECK:  Supple, no jugular venous distention. No thyroid enlargement, no tenderness.  LUNGS: Normal breath sounds bilaterally, no wheezing, rales,rhonchi or crepitation. No use of accessory muscles of respiration.  Decreased bibasilar breath sounds CARDIOVASCULAR: S1, S2 normal. No rubs, or gallops.  Loud 3/6 systolic murmur is present ABDOMEN: Soft, nontender, nondistended. Bowel sounds present. No organomegaly or mass.  EXTREMITIES: No pedal edema, cyanosis, or clubbing.  Right leg lateral dressing in place with no active bleeding.  No drains noted. NEUROLOGIC: Cranial nerves II through XII are intact. Muscle strength 5/5 in all extremities. Sensation intact. Gait not checked.  PSYCHIATRIC: The patient is alert but disoriented SKIN: No obvious rash, lesion, or ulcer.    LABORATORY  PANEL:   CBC Recent Labs  Lab 02/09/18 0426  WBC 8.1  HGB 7.9*  HCT 23.4*  PLT 207   ------------------------------------------------------------------------------------------------------------------  Chemistries  Recent Labs  Lab 02/09/18 0426  NA 139  K 3.9  CL 108  CO2 26  GLUCOSE 152*  BUN 33*  CREATININE 1.80*  CALCIUM 7.9*  AST 31  ALT 17  ALKPHOS 149*  BILITOT 0.6   ------------------------------------------------------------------------------------------------------------------  Cardiac Enzymes No results for input(s): TROPONINI in the last 168 hours. ------------------------------------------------------------------------------------------------------------------  RADIOLOGY:  Dg Chest Portable 1 View  Result Date: 02/07/2018 CLINICAL DATA:  Status post fall.  Preop evaluation. EXAM: PORTABLE CHEST 1 VIEW COMPARISON:  Chest x-ray dated 09/14/2017. FINDINGS: Stable mild cardiomegaly. Prominent mitral annulus calcifications. Atherosclerotic calcifications noted at the aortic arch. LEFT chest wall pacemaker leads appear stable in position. Lungs are clear. Osseous structures about the chest are unremarkable. IMPRESSION: 1. No active disease. No evidence of pneumonia or pulmonary edema. Stable mild cardiomegaly. 2.  Aortic Atherosclerosis (ICD10-I70.0). Electronically Signed   By: Bary Richard M.D.   On: 02/07/2018 19:27   Dg Hip Port Unilat With Pelvis 1v Right  Result Date: 02/08/2018 CLINICAL DATA:  Known right proximal femoral fracture EXAM: DG HIP (WITH OR WITHOUT PELVIS) 1V PORT RIGHT COMPARISON:  02/07/2018 FINDINGS: Single frontal view of the right hip reveals the known proximal right femoral fracture with both subcapital and intratrochanteric components. Increase in the degree of impaction is noted when compared with the prior exam. IMPRESSION: Slight increase in impaction at the fracture site in the  proximal right femur. Electronically Signed   By: Alcide Clever M.D.   On: 02/08/2018 08:07   Dg Hip Operative Unilat W Or W/o Pelvis Right  Result Date: 02/08/2018 CLINICAL DATA:  Status post ORIF for a comminuted subcapital, neck, and intertrochanteric fracture of the right hip. EXAM: OPERATIVE right HIP (WITH PELVIS IF PERFORMED) 4 VIEWS TECHNIQUE: Fluoroscopic spot image(s) were submitted for interpretation post-operatively. COMPARISON:  Preoperative study of today's date FINDINGS: Reported fluoro time is 1 minutes, 59 seconds. The patient has undergone placement of an intramedullary rod and cortical screws for fixation of the intertrochanteric fracture of the right hip. Alignment is now near anatomic. No immediate postprocedure complication is observed. IMPRESSION: Status post ORIF for an intertrochanteric fracture of the right femur. Alignment is now near anatomic. Electronically Signed   By: David  [place] M.D.   On: 02/08/2018 13:00   Dg Hip Unilat  With Pelvis 2-3 Views Right  Result Date: 02/07/2018 CLINICAL DATA:  Fall. EXAM: DG HIP (WITH OR WITHOUT PELVIS) 2-3V RIGHT COMPARISON:  None. FINDINGS: Markedly displaced/comminuted fracture of the RIGHT femoral neck, subcapital and intertrochanteric distribution, with approximately 90 degrees angulation deformity at the main fracture site. Femoral head remains appropriately positioned relative to the acetabulum. No additional fracture seen within the osseous pelvis, although diffuse osteopenia limits characterization of osseous detail. IMPRESSION: Markedly displaced/comminuted fracture of the RIGHT femoral neck, subcapital and intertrochanteric distribution, with approximately 90 degrees angulation deformity at the main fracture site. Electronically Signed   By: Bary Richard M.D.   On: 02/07/2018 19:26   Dg Femur, Min 2 Views Right  Result Date: 02/07/2018 CLINICAL DATA:  Fall. EXAM: RIGHT FEMUR 2 VIEWS COMPARISON:  None. FINDINGS: There is a comminuted and displaced right femoral intertrochanteric  fracture. Severe varus angulation. No subluxation or dislocation. No additional femoral abnormality. IMPRESSION: Severely angulated and displaced right femoral intertrochanteric fracture. Electronically Signed   By: Charlett Nose M.D.   On: 02/07/2018 20:24    EKG:   Orders placed or performed during the hospital encounter of 02/07/18  . EKG 12-Lead  . EKG 12-Lead  . ED EKG  . ED EKG  . EKG    ASSESSMENT AND PLAN:   84 year old female with past medical history significant for history of complete heart block status post dual-chamber pacemaker, dementia, CAD, history of A. fib on warfarin, hyperlipidemia presents to hospital secondary to mechanical fall and right femoral neck and intertrochanteric fracture.  1.  Right hip fracture- x-rays with right femoral neck and intertrochanteric hip fracture.  Appreciate Ortho consult -Postop day 1 today.  Very delirious and also drop in hemoglobin noted. -Started on Lovenox for DVT prophylaxis. -Pain control and physical therapy consult as well.  2.  Acute blood loss postop anemia-patient was on Coumadin prior to surgery.  Hemoglobin dropped from 11-7.9.  1 unit packed RBC transfusion ordered.  Continue to monitor blood counts.  No active bleeding noted.  3.  Atrial fibrillation, history of complete heart block-status post pacemaker.  Appreciate cardiology consult.  Recently interrogated and everything was normal. -Heart rate better controlled. -Continue metoprolol.  Coumadin restarted after surgery.  3.  Hypertension-on Norvasc  4.  Depression and dementia-acute delirium in the hospital. -Avoid opioids if possible.  Avoid benzos. -Monitor closely.  Haldol if needed.  Check urine analysis to rule out infection  5.  DVT prophylaxis-on Lovenox Coumadin also restarted, INR at 1.3   All the records are reviewed and case discussed with Care Management/Social Workerr.  Management plans discussed with the patient, family and they are in  agreement.  CODE STATUS: Full code  TOTAL TIME TAKING CARE OF THIS PATIENT: 36 minutes.   POSSIBLE D/C IN 2 DAYS, DEPENDING ON CLINICAL CONDITION.   Enid BaasKALISETTI,Adian Jablonowski M.D on 02/09/2018 at 10:58 AM  Between 7am to 6pm - Pager - [phone]  After 6pm go to [email or web address] - Social research officer, [email or web address] [email or web address]  [facility]  Office  [phone]  CC: Primary care physician; Marcina MillardParaschos, Alexander, MD

## 2018-02-09 NOTE — Evaluation (Signed)
Physical Therapy Evaluation Patient Details Name: Ashley Mullins MRN: [id number] DOB: 1929-03-01 Today's Date: 02/09/2018   History of Present Illness  Pt is an 84 y.o. female presenting to hospital 02/07/18 s/p fall.  Imaging showing markedly displaced and comminuted fx of R femoral neck, subcapital, and intertrochanteric.  Pt s/p IMN of R femur with cephalomedullary device 02/08/18.  PMH includes dementia, a-fib, CAD, pacemaker, h/o back surgery.  Clinical Impression  Prior to hospital admission, per notes pt was ambulatory with walker.  Pt lives at [facility].  Currently pt is 2 assist with bed mobility and initially requiring assist for sitting balance but improved with cueing (pt leaning to L side d/t R hip/thigh pain).  Pt would benefit from skilled PT to address noted impairments and functional limitations (see below for any additional details).  Upon hospital discharge, recommend pt discharge to STR to attempt to improve pt's functional mobility with WB'ing restrictions.    Follow Up Recommendations SNF    Equipment Recommendations  Wheelchair (measurements PT);Wheelchair cushion (measurements PT);Rolling walker with 5" wheels    Recommendations for Other Services       Precautions / Restrictions Precautions Precautions: Fall Restrictions Weight Bearing Restrictions: Yes RLE Weight Bearing: Touchdown weight bearing      Mobility  Bed Mobility Overal bed mobility: Needs Assistance Bed Mobility: Supine to Sit;Sit to Supine     Supine to sit: Mod assist;Max assist;+2 for physical assistance;HOB elevated Sit to supine: Mod assist;Max assist;+2 for physical assistance;HOB elevated   General bed mobility comments: assist for trunk and B LE's supine to/from sit (increased assist required d/t c/o R hip/thigh pain)  Transfers                 General transfer comment: Deferred d/t pt c/o too much R hip/thigh pain  Ambulation/Gait             General Gait  Details: Deferred d/t pt c/o too much R hip/thigh pain  Stairs            Wheelchair Mobility    Modified Rankin (Stroke Patients Only)       Balance Overall balance assessment: Needs assistance Sitting-balance support: Bilateral upper extremity supported;Feet supported Sitting balance-Leahy Scale: Poor Sitting balance - Comments: pt initially requiring mod to max assist for sitting balance (pt leaning to L side d/t R hip pain) but improved to close SBA but after a couple minutes pt again leaning to L sided d/t c/o R hip/thigh pain Postural control: Left lateral lean                                   Pertinent Vitals/Pain Pain Assessment: Faces Faces Pain Scale: Hurts a little bit(6/10 with mobility; 2/10 with rest) Pain Location: R hip/thigh Pain Descriptors / Indicators: Grimacing;Guarding;Sore;Tender Pain Intervention(s): Limited activity within patient's tolerance;Monitored during session;Repositioned     Home Living Family/patient expects to be discharged to:: Assisted living                 Additional Comments: Per notes pt lives at [facility]    Prior Function           Comments: Per notes pt ambulatory with walker.     Hand Dominance        Extremity/Trunk Assessment   Upper Extremity Assessment Upper Extremity Assessment: Generalized weakness    Lower Extremity Assessment Lower Extremity Assessment: RLE deficits/detail;LLE deficits/detail  RLE Deficits / Details: able to perform AROM DF/PF at least 3/5; limited AROM R LE d/t c/o R hip/thigh pain RLE: Unable to fully assess due to pain LLE Deficits / Details: strength and ROM at least 3/5 AROM DF/PF, hip flexion, and knee flexion/extension    Cervical / Trunk Assessment Cervical / Trunk Assessment: Normal  Communication   Communication: No difficulties  Cognition Arousal/Alertness: Awake/alert Behavior During Therapy: WFL for tasks assessed/performed Overall  Cognitive Status: (Oriented to person only)                                 General Comments: Follows simple commands 75% of time (may by limited d/t R hip pain and pt not wanting to do something d/t pain)      General Comments General comments (skin integrity, edema, etc.): R hip dressings in place; minimal drainage noted.  Nursing cleared pt for participation in physical therapy.  Pt agreeable to PT session with some encouragement.    Exercises  bed mobility/transfer training   Assessment/Plan    PT Assessment Patient needs continued PT services  PT Problem List Decreased strength;Decreased activity tolerance;Decreased balance;Decreased mobility;Pain       PT Treatment Interventions DME instruction;Gait training;Functional mobility training;Therapeutic activities;Therapeutic exercise;Balance training;Patient/family education    PT Goals (Current goals can be found in the Care Plan section)  Acute Rehab PT Goals Patient Stated Goal: to have less pain PT Goal Formulation: With patient Time For Goal Achievement: 02/23/18 Potential to Achieve Goals: Good    Frequency 7X/week   Barriers to discharge Decreased caregiver support      Co-evaluation               AM-PAC PT "6 Clicks" Daily Activity  Outcome Measure Difficulty turning over in bed (including adjusting bedclothes, sheets and blankets)?: Unable Difficulty moving from lying on back to sitting on the side of the bed? : Unable Difficulty sitting down on and standing up from a chair with arms (e.g., wheelchair, bedside commode, etc,.)?: Unable Help needed moving to and from a bed to chair (including a wheelchair)?: Total Help needed walking in hospital room?: Total Help needed climbing 3-5 steps with a railing? : Total 6 Click Score: 6    End of Session Equipment Utilized During Treatment: Gait belt Activity Tolerance: Patient limited by pain Patient left: in bed;with call bell/phone within  reach;with bed alarm set;with SCD's reapplied;Other (comment)(B heels elevated via pillow; bed in lowest position) Nurse Communication: Mobility status;Precautions;Weight bearing status PT Visit Diagnosis: Other abnormalities of gait and mobility (R26.89);Muscle weakness (generalized) (M62.81);History of falling (Z91.81);Pain Pain - Right/Left: Right Pain - part of body: Hip    Time: 1530-1550 PT Time Calculation (min) (ACUTE ONLY): 20 min   Charges:   PT Evaluation $PT Eval Low Complexity: 1 Low PT Treatments $Therapeutic Activity: 8-22 mins       Hendricks LimesEmily Rani Idler, PT 02/09/18, 4:24 PM [phone]

## 2018-02-09 NOTE — Progress Notes (Signed)
ANTICOAGULATION CONSULT NOTE  Pharmacy Consult for warfarin dosing  Indication: atrial fibrillation  Allergies  Allergen Reactions  . Chicken Allergy   . Codeine   . Other     Pt is allergic to [place] and chicken    Patient Measurements: Height: 5\' 6"  (167.6 cm) Weight: 130 lb (59 kg) IBW/kg (Calculated) : 59.3  Vital Signs: Temp: 98.2 F (36.8 C) (08/27 0404) Temp Source: Oral (08/27 0404) BP: 140/62 (08/27 0404) Pulse Rate: 88 (08/27 0404)  Labs: Recent Labs    02/07/18 1855  02/08/18 0410 02/08/18 0603 02/08/18 0903 02/09/18 0426  HGB 12.4  --  11.1*  --   --  7.9*  HCT 36.0  --  32.8*  --   --  23.4*  PLT 265  --  249  --   --  207  LABPROT 28.0*   < >  --  18.5* 17.4* 16.5*  INR 2.64   < >  --  1.56 1.44 1.34  CREATININE 1.51*  --  1.53*  --   --  1.80*   < > = values in this interval not displayed.    Estimated Creatinine Clearance: 20.5 mL/min (A) (by C-G formula based on SCr of 1.8 mg/dL (H)).   Medications:  Scheduled:  . sodium chloride   Intravenous Once  . acetaminophen  1,000 mg Oral Q8H  . amLODipine  5 mg Oral Daily  . atorvastatin  80 mg Oral Daily  . docusate sodium  100 mg Oral BID  . enoxaparin (LOVENOX) injection  30 mg Subcutaneous Q24H  . feeding supplement (PRO-STAT SUGAR FREE 64)  15 mL Oral BID  . ferrous sulfate  325 mg Oral Q breakfast  . metoprolol tartrate  50 mg Oral BID  . OLANZapine  1.25 mg Oral TID WC  . potassium chloride  10 mEq Oral Daily  . QUEtiapine  150 mg Oral QHS  . QUEtiapine  50 mg Oral Daily  . Warfarin - Pharmacist Dosing Inpatient   Does not apply q1800   Infusions:  . sodium chloride 75 mL/hr at 02/09/18 0310  . methocarbamol (ROBAXIN) IV    . sodium chloride      Assessment: Pharmacy consulted for warfarin dosing for 84 yo female on warfarin as an outpatient for atrial fibrillation, goal INR 2-3. Patient received Vitamin K 3mg  on 8/25 and vitamin K 1mg  on 8/26 in preparation for right hip  cephalomedullary nailing. Patient initiated on enoxaparin 30mg  SQ Q24hr while INR is subtherapeutic.   Goal of Therapy:  INR 2-3 Monitor platelets by anticoagulation protocol: Yes   Assessment  Patient takes warfarin 3mg  Daily as an outpatient. Due to vitamin K exposure,  Warfarin 4mg  at 1800 was ordered on 0826. Will obtain daily INRs and adjust as appropriate.   Plan: 0827 INR 1.34 @ 0426 Warfarin 4 mg @ 1800, bridging with Lovenox 30 mg until INR therapeutic. Will obtain daily INRs and adjust as appropriate.  Pharmacy will continue to monitor and adjust per consult.     Mauri ReadingSavanna M Martin, PharmD Pharmacy Resident  02/09/2018 8:17 AM

## 2018-02-09 NOTE — Progress Notes (Signed)
OT Cancellation Note  Patient Details Name: Ashley Mullins MRN: [id number] DOB: 08/09/1928   Cancelled Treatment:    Reason Eval/Treat Not Completed: Fatigue/lethargy limiting ability to participate RN reports that pt very fatigued d/t several tests, staple removal, and large stool followed by bed bath. OT and RN attempt to wake pt for eval to no avail. Will f/u as pt becomes more appropriate and able to participate.  Rejeana Brocklison Glorian Mcdonell, MS, OTR/L ascom [phone] or [phone] 02/09/18, 4:51 PM

## 2018-02-09 NOTE — Discharge Instructions (Signed)
INSTRUCTIONS AFTER Surgery  o Remove items at home which could result in a fall. This includes throw rugs or furniture in walking pathways o ICE to the affected joint every three hours while awake for 30 minutes at a time, for at least the first 3-5 days, and then as needed for pain and swelling.  Continue to use ice for pain and swelling. You may notice swelling that will progress down to the foot and ankle.  This is normal after surgery.  Elevate your leg when you are not up walking on it.   o Continue to use the breathing machine you got in the hospital (incentive spirometer) which will help keep your temperature down.  It is common for your temperature to cycle up and down following surgery, especially at night when you are not up moving around and exerting yourself.  The breathing machine keeps your lungs expanded and your temperature down.   DIET:  As you were doing prior to hospitalization, we recommend a well-balanced diet.  DRESSING / WOUND CARE / SHOWERING  Dressing is waterproof.  Able to get wet in shower.  Change bandage as needed.  ACTIVITY  o Increase activity slowly as tolerated, but follow the weight bearing instructions below.   o No driving for 6 weeks or until further direction given by your physician.  You cannot drive while taking narcotics.  o No lifting or carrying greater than 10 lbs. until further directed by your surgeon. o Avoid periods of inactivity such as sitting longer than an hour when not asleep. This helps prevent blood clots.  o You may return to work once you are authorized by your doctor.     WEIGHT BEARING  Weightbearing as tolerated   EXERCISES Gait training and ambulation.  Occupational Therapy.  CONSTIPATION  Constipation is defined medically as fewer than three stools per week and severe constipation as less than one stool per week.  Even if you have a regular bowel pattern at home, your normal regimen is likely to be disrupted due to  multiple reasons following surgery.  Combination of anesthesia, postoperative narcotics, change in appetite and fluid intake all can affect your bowels.   YOU MUST use at least one of the following options; they are listed in order of increasing strength to get the job done.  They are all available over the counter, and you may need to use some, POSSIBLY even all of these options:    Drink plenty of fluids (prune juice may be helpful) and high fiber foods Colace 100 mg by mouth twice a day  Senokot for constipation as directed and as needed Dulcolax (bisacodyl), take with full glass of water  Miralax (polyethylene glycol) once or twice a day as needed.  If you have tried all these things and are unable to have a bowel movement in the first 3-4 days after surgery call either your surgeon or your primary doctor.    If you experience loose stools or diarrhea, hold the medications until you stool forms back up.  If your symptoms do not get better within 1 week or if they get worse, check with your doctor.  If you experience "the worst abdominal pain ever" or develop nausea or vomiting, please contact the office immediately for further recommendations for treatment.   ITCHING:  If you experience itching with your medications, try taking only a single pain pill, or even half a pain pill at a time.  You can also use Benadryl over the  counter for itching or also to help with sleep.   TED HOSE STOCKINGS:  Use stockings on both legs until for at least 2 weeks or as directed by physician office. They may be removed at night for sleeping.  MEDICATIONS:  See your medication summary on the After Visit Summary that nursing will review with you.  You may have some home medications which will be placed on hold until you complete the course of blood thinner medication.  It is important for you to complete the blood thinner medication as prescribed.  PRECAUTIONS:  If you experience chest pain or shortness of  breath - call 911 immediately for transfer to the hospital emergency department.   If you develop a fever greater that 101 F, purulent drainage from wound, increased redness or drainage from wound, foul odor from the wound/dressing, or calf pain - CONTACT YOUR SURGEON.                                                   FOLLOW-UP APPOINTMENTS:  If you do not already have a post-op appointment, please call the office for an appointment to be seen by your surgeon.  Guidelines for how soon to be seen are listed in your After Visit Summary, but are typically between 1-4 weeks after surgery.  OTHER INSTRUCTIONS:     MAKE SURE YOU:   Understand these instructions.   Get help right away if you are not doing well or get worse.    Thank you for letting us be a part of your medical care team.  It is a privilege we respect greatly.  We hope these instructions will help you stay on track for a fast and full recovery!

## 2018-02-09 NOTE — Progress Notes (Signed)
PT Cancellation Note  Patient Details Name: Ashley Mullins MRN: [id number] DOB: 01/17/1929   Cancelled Treatment:    Reason Eval/Treat Not Completed: Other (comment).  PT consult received.  Chart reviewed.  Nursing in process of starting blood transfusion.  Will hold PT at this time and re-attempt PT evaluation later today after blood transfusion is finished (discussed with nurse).  Hendricks LimesEmily Sritha Chauncey, PT 02/09/18, 9:17 AM [phone]

## 2018-02-10 LAB — COMPREHENSIVE METABOLIC PANEL
ALT: 9 U/L (ref 0–44)
ANION GAP: 4 — AB (ref 5–15)
AST: 38 U/L (ref 15–41)
Albumin: 2.4 g/dL — ABNORMAL LOW (ref 3.5–5.0)
Alkaline Phosphatase: 126 U/L (ref 38–126)
BUN: 32 mg/dL — ABNORMAL HIGH (ref 8–23)
CHLORIDE: 112 mmol/L — AB (ref 98–111)
CO2: 25 mmol/L (ref 22–32)
Calcium: 7.8 mg/dL — ABNORMAL LOW (ref 8.9–10.3)
Creatinine, Ser: 1.64 mg/dL — ABNORMAL HIGH (ref 0.44–1.00)
GFR calc Af Amer: 31 mL/min — ABNORMAL LOW (ref >60)
GFR calc non Af Amer: 27 mL/min — ABNORMAL LOW (ref >60)
Glucose, Bld: 120 mg/dL — ABNORMAL HIGH (ref 70–99)
POTASSIUM: 3.7 mmol/L (ref 3.5–5.1)
SODIUM: 141 mmol/L (ref 135–145)
Total Bilirubin: 0.6 mg/dL (ref 0.3–1.2)
Total Protein: 5.3 g/dL — ABNORMAL LOW (ref 6.5–8.1)

## 2018-02-10 LAB — TYPE AND SCREEN
ABO/RH(D): O POS
Antibody Screen: NEGATIVE
Unit division: 0

## 2018-02-10 LAB — CBC
HCT: 25.4 % — ABNORMAL LOW (ref 35.0–47.0)
HEMOGLOBIN: 8.6 g/dL — AB (ref 12.0–16.0)
MCH: 30.1 pg (ref 26.0–34.0)
MCHC: 34 g/dL (ref 32.0–36.0)
MCV: 88.7 fL (ref 80.0–100.0)
Platelets: 169 10*3/uL (ref 150–440)
RBC: 2.87 MIL/uL — AB (ref 3.80–5.20)
RDW: 15.3 % — ABNORMAL HIGH (ref 11.5–14.5)
WBC: 11.2 10*3/uL — AB (ref 3.6–11.0)

## 2018-02-10 LAB — PROTIME-INR
INR: 1.84
Prothrombin Time: 21.1 seconds — ABNORMAL HIGH (ref 11.4–15.2)

## 2018-02-10 LAB — BPAM RBC
Blood Product Expiration Date: 201909102359
ISSUE DATE / TIME: 201908270911
Unit Type and Rh: 5100

## 2018-02-10 MED ORDER — WARFARIN SODIUM 3 MG PO TABS
3.0000 mg | ORAL_TABLET | Freq: Once | ORAL | Status: DC
  Filled 2018-02-10: qty 1

## 2018-02-10 MED ORDER — WARFARIN SODIUM 2 MG PO TABS: 2.0000 mg | ORAL_TABLET | Freq: Every day | ORAL | 2 refills | Status: DC

## 2018-02-10 MED ORDER — POLYETHYLENE GLYCOL 3350 17 G PO PACK: 17.0000 g | PACK | Freq: Every day | ORAL | 0 refills | Status: AC | PRN

## 2018-02-10 MED ORDER — DOCUSATE SODIUM 100 MG PO CAPS: 100.0000 mg | ORAL_CAPSULE | Freq: Two times a day (BID) | ORAL | 0 refills | Status: AC

## 2018-02-10 MED ORDER — OLANZAPINE 2.5 MG PO TABS: 1.2500 mg | ORAL_TABLET | Freq: Three times a day (TID) | ORAL | 2 refills | Status: DC

## 2018-02-10 MED ORDER — TRAMADOL HCL 50 MG PO TABS: 50.0000 mg | ORAL_TABLET | Freq: Four times a day (QID) | ORAL | 0 refills | Status: DC | PRN

## 2018-02-10 MED ORDER — QUETIAPINE FUMARATE 50 MG PO TABS: 50.0000 mg | ORAL_TABLET | Freq: Every day | ORAL | Status: DC

## 2018-02-10 NOTE — Care Management Important Message (Signed)
Important Message  Patient Details  Name: Ashley Mullins MRN: [id number] Date of Birth: 09/15/1928   Medicare Important Message Given:  Yes    [name] 02/10/2018, 8:35 AM

## 2018-02-10 NOTE — Progress Notes (Signed)
Patient is medically stable for D/C to [facility] today. Per Inetta Fermoina [facility] liaison patient can come today to room 507. RN will call report and arrange EMS for transport. Clinical Child psychotherapistocial Worker (CSW) sent D/C orders to [facility] via [facility]. CSW contacted patient's daughter [name] and made her aware of above. Please reconsult if future social work needs arise. CSW signing off.   Baker Hughes IncorporatedBailey Jai Steil, LCSW [phone]

## 2018-02-10 NOTE — Discharge Summary (Addendum)
[facility] - [place] [facility]   PATIENT NAME: Ashley Mullins    MR#:  [id number]  DATE OF BIRTH:  11-02-1928  DATE OF ADMISSION:  02/07/2018   ADMITTING PHYSICIAN: Shaune Pollack, MD  DATE OF DISCHARGE:  02/10/18  PRIMARY CARE PHYSICIAN: Marcina Millard, MD   ADMISSION DIAGNOSIS:   Hip fracture (HCC) [S72.009A] Fall, initial encounter [W19.XXXA] Closed fracture of right hip, initial encounter (HCC) [S72.001A]  DISCHARGE DIAGNOSIS:   Active Problems:   Closed right hip fracture (HCC)   SECONDARY DIAGNOSIS:   Past Medical History:  Diagnosis Date  . A-fib (HCC)   . CAD (coronary artery disease)   . Dementia   . Depression   . Hyperlipidemia   . Irregular heart beat   . Pacemaker     HOSPITAL COURSE:   84 year old female with past medical history significant for history of complete heart block status post dual-chamber pacemaker, dementia, CAD, history of A. fib on warfarin, CKD stage III, hyperlipidemia presents to hospital secondary to mechanical fall and right femoral neck and intertrochanteric fracture.  1.  Right hip fracture- x-rays with right femoral neck and intertrochanteric hip fracture.  Appreciate Ortho consult -Postop day 2  today.   -Patient has postoperative acute anemia-received 1 unit transfusion and hemoglobin is stable at greater than 8 -Will be discharged on Coumadin for DVT prophylaxis -Very sensitive to pain medications.  Tramadol and Tylenol for pain .  2.  Acute blood loss postop anemia-patient was on Coumadin prior to surgery.  Hemoglobin dropped from 11-7.9.   -Received 1 unit packed RBC transfusion and hemoglobin is stable at 8.6.  Continue to monitor blood counts.  No active bleeding noted.  3.  Atrial fibrillation, history of complete heart block-status post pacemaker.  Appreciate cardiology consult.  Recently interrogated and everything was normal. -Heart rate better controlled. -Continue metoprolol.  Coumadin  restarted after surgery.  INR is a 1.85  3.  Hypertension-on Norvasc  4.  Depression and dementia-acute delirium in the hospital.  Resolved. -Continue Seroquel and and on Zyprexa 3 times a day -Avoid opioids if possible.  Avoid benzos. -Monitor closely.  Urine analysis negative for any infection  5.  DVT prophylaxis-on Coumadin Coumadin  restarted, INR at 1.8  Will be discharged to rehab today  DISCHARGE CONDITIONS:   Guarded  CONSULTS OBTAINED:   Treatment Team:  Signa Kell, MD  DRUG ALLERGIES:   Allergies  Allergen Reactions  . Chicken Allergy   . Codeine   . Other     Pt is allergic to [place] and chicken   DISCHARGE MEDICATIONS:   Allergies as of 02/10/2018      Reactions   Chicken Allergy    Codeine    Other    Pt is allergic to [place] and chicken      Medication List    STOP taking these medications   memantine 5 MG tablet Commonly known as:  NAMENDA     TAKE these medications   acetaminophen 650 MG CR tablet Commonly known as:  TYLENOL Take 650 mg by mouth every 8 (eight) hours.   amLODipine 5 MG tablet Commonly known as:  NORVASC Take 5 mg by mouth daily.   atorvastatin 80 MG tablet Commonly known as:  LIPITOR Take 80 mg by mouth daily.   docusate sodium 100 MG capsule Commonly known as:  COLACE Take 1 capsule (100 mg total) by mouth 2 (two) times daily.   feeding supplement (PRO-STAT SUGAR FREE 64)  Liqd Take 15 mLs by mouth 2 (two) times daily.   ferrous sulfate 325 (65 FE) MG tablet Take 325 mg by mouth daily with breakfast.   furosemide 40 MG tablet Commonly known as:  LASIX Take 40 mg by mouth daily.   metoprolol tartrate 50 MG tablet Commonly known as:  LOPRESSOR Take 50 mg by mouth 2 (two) times daily.   nitroGLYCERIN 0.4 MG SL tablet Commonly known as:  NITROSTAT Place 0.4 mg under the tongue every 5 (five) minutes as needed for chest pain.   OLANZapine 2.5 MG tablet Commonly known as:  ZYPREXA Take 0.5 tablets  (1.25 mg total) by mouth 3 (three) times daily with meals. What changed:    medication strength  how much to take   polyethylene glycol packet Commonly known as:  MIRALAX / GLYCOLAX Take 17 g by mouth daily as needed for mild constipation.   potassium chloride 10 MEQ tablet Commonly known as:  K-DUR,KLOR-CON Take 10 mEq by mouth daily.   QUEtiapine 50 MG tablet Commonly known as:  SEROQUEL Take 1-3 tablets (50-150 mg total) by mouth daily. Take 50 mg by mouth in the morning and 150 mg by mouth at bedtime.   traMADol 50 MG tablet Commonly known as:  ULTRAM Take 1 tablet (50 mg total) by mouth every 6 (six) hours as needed for moderate pain.   warfarin 2 MG tablet Commonly known as:  COUMADIN Take 1 tablet (2 mg total) by mouth daily. What changed:    how much to take  Another medication with the same name was removed. Continue taking this medication, and follow the directions you see here.        DISCHARGE INSTRUCTIONS:   1.  PCP follow-up in 1 to 2 weeks 2.  INR check in 2 to 3 days and hemoglobin check in 3 days 3.  Orthopedic follow-up as scheduled  DIET:   Cardiac diet  ACTIVITY:   Activity as tolerated  OXYGEN:   Home Oxygen: No.  Oxygen Delivery: room air  DISCHARGE LOCATION:   nursing home   If you experience worsening of your admission symptoms, develop shortness of breath, life threatening emergency, suicidal or homicidal thoughts you must seek medical attention immediately by calling 911 or calling your MD immediately  if symptoms less severe.  You Must read complete instructions/literature along with all the possible adverse reactions/side effects for all the Medicines you take and that have been prescribed to you. Take any new Medicines after you have completely understood and accpet all the possible adverse reactions/side effects.   Please note  You were cared for by a hospitalist during your hospital stay. If you have any questions about  your discharge medications or the care you received while you were in the hospital after you are discharged, you can call the unit and asked to speak with the hospitalist on call if the hospitalist that took care of you is not available. Once you are discharged, your primary care physician will handle any further medical issues. Please note that NO REFILLS for any discharge medications will be authorized once you are discharged, as it is imperative that you return to your primary care physician (or establish a relationship with a primary care physician if you do not have one) for your aftercare needs so that they can reassess your need for medications and monitor your lab values.    On the day of Discharge:  VITAL SIGNS:   Blood pressure (!) 131/51, pulse 63,  temperature 98 F (36.7 C), temperature source Axillary, resp. rate 14, height 5\' 6"  (1.676 m), weight 59 kg, SpO2 100 %.  PHYSICAL EXAMINATION:   GENERAL:  84 y.o.-year-old patient lying in the bed with no acute distress.  EYES: Pupils equal, round, reactive to light and accommodation. No scleral icterus. Extraocular muscles intact.  HEENT: Head atraumatic, normocephalic. Oropharynx and nasopharynx clear.  Dry mucous membranes noted NECK:  Supple, no jugular venous distention. No thyroid enlargement, no tenderness.  LUNGS: Normal breath sounds bilaterally, no wheezing, rales,rhonchi or crepitation. No use of accessory muscles of respiration.  Decreased bibasilar breath sounds CARDIOVASCULAR: S1, S2 normal. No rubs, or gallops.  Loud 3/6 systolic murmur is present ABDOMEN: Soft, nontender, nondistended. Bowel sounds present. No organomegaly or mass.  EXTREMITIES: No pedal edema, cyanosis, or clubbing.  Right leg lateral dressing in place with no active bleeding.  No drains noted. NEUROLOGIC: Cranial nerves II through XII are intact. Muscle strength 5/5 in all extremities. Sensation intact. Gait not checked.  PSYCHIATRIC: The patient is  alert  and oriented x2 SKIN: No obvious rash, lesion, or ulcer   DATA REVIEW:   CBC Recent Labs  Lab 02/10/18 0500  WBC 11.2*  HGB 8.6*  HCT 25.4*  PLT 169    Chemistries  Recent Labs  Lab 02/10/18 0500  NA 141  K 3.7  CL 112*  CO2 25  GLUCOSE 120*  BUN 32*  CREATININE 1.64*  CALCIUM 7.8*  AST 38  ALT 9  ALKPHOS 126  BILITOT 0.6     Microbiology Results  Results for orders placed or performed during the hospital encounter of 02/07/18  MRSA PCR Screening     Status: None   Collection Time: 02/07/18  9:25 PM  Result Value Ref Range Status   MRSA by PCR NEGATIVE NEGATIVE Final    Comment:        The GeneXpert MRSA Assay (FDA approved for NASAL specimens only), is one component of a comprehensive MRSA colonization surveillance program. It is not intended to diagnose MRSA infection nor to guide or monitor treatment for MRSA infections. Performed at [facility], [address], [place], [place] [address]     RADIOLOGY:  No results found.   Management plans discussed with the patient, family and they are in agreement.  CODE STATUS:     Code Status Orders  (From admission, onward)         Start     Ordered   02/07/18 2114  DNR  Continuous     02/07/18 2114        Code Status History    This patient has a current code status but no historical code status.      TOTAL TIME TAKING CARE OF THIS PATIENT: 38 minutes.    Enid Baas M.D on 02/10/2018 at 9:35 AM  Between 7am to 6pm - Pager - [phone]  After 6pm go to [email or web address] - Social research officer, government  [facility] [place] [facility]  Office  [phone]  CC: Primary care physician; Marcina Millard, MD   Note: This dictation was prepared with Dragon dictation along with smaller phrase technology. Any transcriptional errors that result from this process are unintentional.

## 2018-02-10 NOTE — Progress Notes (Signed)
Confirmed with daughter Ms. Siri Coleheresa Richter over the phone about Ashley Mullins's CODE STATUS.  Daughter is her healthcare power of attorney.  Daughter has confirmed that Ms. Lamekia Porrata is a DO NOT RESUSCITATE.

## 2018-02-10 NOTE — Progress Notes (Signed)
OT Cancellation Note  Patient Details Name: Ashley Mullins MRN: [id number] DOB: 12/11/1928   Cancelled Treatment:    Reason Eval/Treat Not Completed: Fatigue/lethargy limiting ability to participate. Order received, chart reviewed. Upon attempt, pt sleeping soundly. Did not wake to verbal or tactile stimuli. Will re-attempt at later time as pt is able to participate and medically appropriate.   Richrd PrimeJamie Stiller, MPH, MS, OTR/L ascom [phone] 02/10/18, 9:19 AM

## 2018-02-10 NOTE — Progress Notes (Signed)
ANTICOAGULATION CONSULT NOTE  Pharmacy Consult for warfarin dosing  Indication: atrial fibrillation  Allergies  Allergen Reactions  . Chicken Allergy   . Codeine   . Other     Pt is allergic to [place] and chicken    Patient Measurements: Height: 5\' 6"  (167.6 cm) Weight: 130 lb (59 kg) IBW/kg (Calculated) : 59.3  Vital Signs: Temp: 99.9 F (37.7 C) (08/27 2358) Temp Source: Axillary (08/27 2358) BP: 106/47 (08/27 2353) Pulse Rate: 68 (08/27 2353)  Labs: Recent Labs    02/08/18 0410  02/08/18 0903 02/09/18 0426 02/10/18 0500  HGB 11.1*  --   --  7.9* 8.6*  HCT 32.8*  --   --  23.4* 25.4*  PLT 249  --   --  207 169  LABPROT  --    < > 17.4* 16.5* 21.1*  INR  --    < > 1.44 1.34 1.84  CREATININE 1.53*  --   --  1.80* 1.64*   < > = values in this interval not displayed.    Estimated Creatinine Clearance: 22.5 mL/min (A) (by C-G formula based on SCr of 1.64 mg/dL (H)).   Medications:  Scheduled:  . sodium chloride   Intravenous Once  . amLODipine  5 mg Oral Daily  . atorvastatin  80 mg Oral Daily  . docusate sodium  100 mg Oral BID  . enoxaparin (LOVENOX) injection  30 mg Subcutaneous Q24H  . feeding supplement (PRO-STAT SUGAR FREE 64)  15 mL Oral BID  . ferrous sulfate  325 mg Oral Q breakfast  . metoprolol tartrate  50 mg Oral BID  . OLANZapine  1.25 mg Oral TID WC  . polyethylene glycol  17 g Oral Daily  . potassium chloride  10 mEq Oral Daily  . QUEtiapine  150 mg Oral QHS  . QUEtiapine  50 mg Oral Daily  . warfarin  4 mg Oral ONCE-1800  . Warfarin - Pharmacist Dosing Inpatient   Does not apply q1800   Infusions:  . sodium chloride 75 mL/hr at 02/09/18 1827  . methocarbamol (ROBAXIN) IV    . sodium chloride      Assessment: Pharmacy consulted for warfarin dosing for 84 yo female on warfarin as an outpatient for atrial fibrillation, goal INR 2-3. Patient received Vitamin K 3mg  on 8/25 and vitamin K 1mg  on 8/26 in preparation for right hip  cephalomedullary nailing. Patient initiated on enoxaparin 30mg  SQ Q24hr while INR is subtherapeutic. Patient takes warfarin 3mg  Daily as an outpatient.  0827 INR 1.34 @ 0426 Warfarin 4 mg @ 1800, bridging with Lovenox 30 mg until INR therapeutic  Goal of Therapy:  INR 2-3 Monitor platelets by anticoagulation protocol: Yes   Plan: 0828 INR 1.84 @ 0500. Will order Warfarin 3 mg @ 1800, bridging with Lovenox 30 mg daily until INR therapeutic x 2. Will obtain daily INRs and adjust as appropriate.  Pharmacy will continue to monitor and adjust per consult.    Mauri ReadingSavanna M Martin, PharmD Pharmacy Resident  02/10/2018 7:40 AM

## 2018-02-10 NOTE — Progress Notes (Signed)
Report called to [facility]. See discharge navigator. EMS called for transport.

## 2018-02-10 NOTE — Clinical Social Work Placement (Signed)
   CLINICAL SOCIAL WORK PLACEMENT  NOTE  Date:  02/10/2018  Patient Details  Name: Ashley Mullins MRN: [id number] Date of Birth: 08/25/1928  Clinical Social Work is seeking post-discharge placement for this patient at the Skilled  Nursing Facility level of care (*CSW will initial, date and re-position this form in  chart as items are completed):  Yes   Patient/family provided with [place] Clinical Social Work Department's list of facilities offering this level of care within the geographic area requested by the patient (or if unable, by the patient's family).  Yes   Patient/family informed of their freedom to choose among providers that offer the needed level of care, that participate in Medicare, Medicaid or managed care program needed by the patient, have an available bed and are willing to accept the patient.  Yes   Patient/family informed of [facility]'s ownership interest in [facility] and [facility], as well as of the fact that they are under no obligation to receive care at these facilities.  PASRR submitted to EDS on 02/08/18     PASRR number received on 02/08/18     Existing PASRR number confirmed on       FL2 transmitted to all facilities in geographic area requested by pt/family on 02/08/18     FL2 transmitted to all facilities within larger geographic area on       Patient informed that his/her managed care company has contracts with or will negotiate with certain facilities, including the following:        Yes   Patient/family informed of bed offers received.  [facility] at [facility]     Physician recommends and patient chooses bed at      Patient to be transferred to ([facility] ) on 02/10/18.  [facility]     Patient family notified on 02/10/18 of transfer.  Name of family member notified:  (Patient's daughter [name] is aware of D/C today. )     PHYSICIAN       Additional Comment:      _______________________________________________ Antania Hoefling, Darleen CrockerBailey M, LCSW 02/10/2018, 10:59 AM

## 2018-02-10 NOTE — Progress Notes (Signed)
  Subjective: 2 Days Post-Op Procedure(s) (LRB): INTRAMEDULLARY (IM) NAIL INTERTROCHANTRIC (Right) Patient reports pain as mild.   Patient seen in rounds with Dr. Allena KatzPatel. Patient is well, and has had no acute complaints or problems.  The patient had pulled off staples the night before. Plan is to go Skilled nursing facility after hospital stay. Negative for chest pain and shortness of breath Fever: no Gastrointestinal: Negative for nausea and vomiting  Objective: Vital signs in last 24 hours: Temp:  [98.3 F (36.8 C)-99.9 F (37.7 C)] 99.9 F (37.7 C) (08/27 2358) Pulse Rate:  [68-82] 68 (08/27 2353) Resp:  [14-16] 14 (08/27 2353) BP: (106-144)/(44-90) 106/47 (08/27 2353) SpO2:  [95 %-99 %] 99 % (08/27 2353)  Intake/Output from previous day:  Intake/Output Summary (Last 24 hours) at 02/10/2018 0622 Last data filed at 02/09/2018 1300 Gross per 24 hour  Intake 570 ml  Output -  Net 570 ml    Intake/Output this shift: No intake/output data recorded.  Labs: Recent Labs    02/07/18 1855 02/08/18 0410 02/09/18 0426 02/10/18 0500  HGB 12.4 11.1* 7.9* 8.6*   Recent Labs    02/09/18 0426 02/10/18 0500  WBC 8.1 11.2*  RBC 2.66* 2.87*  HCT 23.4* 25.4*  PLT 207 169   Recent Labs    02/09/18 0426 02/10/18 0500  NA 139 141  K 3.9 3.7  CL 108 112*  CO2 26 25  BUN 33* 32*  CREATININE 1.80* 1.64*  GLUCOSE 152* 120*  CALCIUM 7.9* 7.8*   Recent Labs    02/09/18 0426 02/10/18 0500  INR 1.34 1.84     EXAM General - Patient is Confused and Delusional Extremity - Dorsiflexion/Plantar flexion intact No cellulitis present Dressing/Incision - clean, dry, Proximal wound has more dry serosanguineous drainage with no active bleeding drainage.  3 staples have been pulled off the previous night from the most distal wound.  After cleaning the wound benzoin was used as well as new Steri-Strips for more wound closure. Motor Function - intact, moving foot and toes well on  exam.   Past Medical History:  Diagnosis Date  . A-fib (HCC)   . CAD (coronary artery disease)   . Dementia   . Depression   . Hyperlipidemia   . Irregular heart beat   . Pacemaker     Assessment/Plan: 2 Days Post-Op Procedure(s) (LRB): INTRAMEDULLARY (IM) NAIL INTERTROCHANTRIC (Right) Active Problems:   Closed right hip fracture (HCC)  Estimated body mass index is 20.98 kg/m as calculated from the following:   Height as of this encounter: 5\' 6"  (1.676 m).   Weight as of this encounter: 59 kg. Advance diet Up with therapy D/C IV fluids Discharge to SNF when cleared by medicine.  DVT Prophylaxis - Lovenox, Foot Pumps and TED hose Weight-Bearing as tolerated to right leg  Dedra Skeensodd Yaden Seith, PA-C Orthopaedic Surgery 02/10/2018, 6:22 AM

## 2018-02-10 NOTE — Progress Notes (Signed)
EMS here for transfer. VS stable. IV removed. Patient helped onto stretcher.

## 2018-03-05 ENCOUNTER — Emergency Department: Payer: Medicare Other

## 2018-03-05 ENCOUNTER — Other Ambulatory Visit: Payer: Self-pay

## 2018-03-05 ENCOUNTER — Observation Stay
Admission: EM | Admit: 2018-03-05 | Discharge: 2018-03-07 | Disposition: A | Discharge status: Skilled Nursing Facility | Payer: Medicare Other | Attending: Specialist | Admitting: Specialist

## 2018-03-05 DIAGNOSIS — G3189 Other specified degenerative diseases of nervous system: Secondary | ICD-10-CM | POA: Diagnosis not present

## 2018-03-05 DIAGNOSIS — I482 Chronic atrial fibrillation: Secondary | ICD-10-CM | POA: Insufficient documentation

## 2018-03-05 DIAGNOSIS — R4182 Altered mental status, unspecified: Secondary | ICD-10-CM

## 2018-03-05 DIAGNOSIS — Z885 Allergy status to narcotic agent status: Secondary | ICD-10-CM | POA: Insufficient documentation

## 2018-03-05 DIAGNOSIS — I442 Atrioventricular block, complete: Secondary | ICD-10-CM | POA: Insufficient documentation

## 2018-03-05 DIAGNOSIS — E785 Hyperlipidemia, unspecified: Secondary | ICD-10-CM | POA: Diagnosis not present

## 2018-03-05 DIAGNOSIS — T8141XA Infection following a procedure, superficial incisional surgical site, initial encounter: Secondary | ICD-10-CM | POA: Insufficient documentation

## 2018-03-05 DIAGNOSIS — Z950 Presence of cardiac pacemaker: Secondary | ICD-10-CM | POA: Diagnosis not present

## 2018-03-05 DIAGNOSIS — Z7901 Long term (current) use of anticoagulants: Secondary | ICD-10-CM | POA: Insufficient documentation

## 2018-03-05 DIAGNOSIS — F329 Major depressive disorder, single episode, unspecified: Secondary | ICD-10-CM | POA: Diagnosis not present

## 2018-03-05 DIAGNOSIS — R64 Cachexia: Secondary | ICD-10-CM

## 2018-03-05 DIAGNOSIS — N183 Chronic kidney disease, stage 3 (moderate): Secondary | ICD-10-CM | POA: Insufficient documentation

## 2018-03-05 DIAGNOSIS — R791 Abnormal coagulation profile: Principal | ICD-10-CM | POA: Insufficient documentation

## 2018-03-05 DIAGNOSIS — D509 Iron deficiency anemia, unspecified: Secondary | ICD-10-CM | POA: Insufficient documentation

## 2018-03-05 DIAGNOSIS — Z87891 Personal history of nicotine dependence: Secondary | ICD-10-CM | POA: Insufficient documentation

## 2018-03-05 DIAGNOSIS — Z66 Do not resuscitate: Secondary | ICD-10-CM | POA: Diagnosis not present

## 2018-03-05 DIAGNOSIS — Z96641 Presence of right artificial hip joint: Secondary | ICD-10-CM | POA: Diagnosis not present

## 2018-03-05 DIAGNOSIS — I1310 Hypertensive heart and chronic kidney disease without heart failure, with stage 1 through stage 4 chronic kidney disease, or unspecified chronic kidney disease: Secondary | ICD-10-CM | POA: Diagnosis not present

## 2018-03-05 DIAGNOSIS — R748 Abnormal levels of other serum enzymes: Secondary | ICD-10-CM | POA: Diagnosis not present

## 2018-03-05 DIAGNOSIS — Z886 Allergy status to analgesic agent status: Secondary | ICD-10-CM | POA: Diagnosis not present

## 2018-03-05 DIAGNOSIS — D649 Anemia, unspecified: Secondary | ICD-10-CM | POA: Diagnosis not present

## 2018-03-05 DIAGNOSIS — Z79899 Other long term (current) drug therapy: Secondary | ICD-10-CM | POA: Insufficient documentation

## 2018-03-05 DIAGNOSIS — I2510 Atherosclerotic heart disease of native coronary artery without angina pectoris: Secondary | ICD-10-CM | POA: Insufficient documentation

## 2018-03-05 DIAGNOSIS — Z23 Encounter for immunization: Secondary | ICD-10-CM | POA: Diagnosis not present

## 2018-03-05 DIAGNOSIS — F0390 Unspecified dementia without behavioral disturbance: Secondary | ICD-10-CM | POA: Diagnosis not present

## 2018-03-05 LAB — TROPONIN I: Troponin I: <0.03 ng/mL (ref <0.03)

## 2018-03-05 LAB — COMPREHENSIVE METABOLIC PANEL
ALBUMIN: 2.8 g/dL — AB (ref 3.5–5.0)
ALT: 30 U/L (ref 0–44)
AST: 45 U/L — AB (ref 15–41)
Alkaline Phosphatase: 238 U/L — ABNORMAL HIGH (ref 38–126)
Anion gap: 8 (ref 5–15)
BILIRUBIN TOTAL: 0.9 mg/dL (ref 0.3–1.2)
BUN: 31 mg/dL — AB (ref 8–23)
CHLORIDE: 103 mmol/L (ref 98–111)
CO2: 25 mmol/L (ref 22–32)
Calcium: 8.2 mg/dL — ABNORMAL LOW (ref 8.9–10.3)
Creatinine, Ser: 1.67 mg/dL — ABNORMAL HIGH (ref 0.44–1.00)
GFR calc Af Amer: 31 mL/min — ABNORMAL LOW (ref >60)
GFR calc non Af Amer: 26 mL/min — ABNORMAL LOW (ref >60)
GLUCOSE: 123 mg/dL — AB (ref 70–99)
Potassium: 4.8 mmol/L (ref 3.5–5.1)
Sodium: 136 mmol/L (ref 135–145)
TOTAL PROTEIN: 6.9 g/dL (ref 6.5–8.1)

## 2018-03-05 LAB — URINALYSIS, COMPLETE (UACMP) WITH MICROSCOPIC
BILIRUBIN URINE: NEGATIVE
Bacteria, UA: NONE SEEN
Glucose, UA: NEGATIVE mg/dL
Ketones, ur: NEGATIVE mg/dL
Nitrite: NEGATIVE
PH: 6 (ref 5.0–8.0)
Protein, ur: NEGATIVE mg/dL
SPECIFIC GRAVITY, URINE: 1.013 (ref 1.005–1.030)

## 2018-03-05 LAB — CBC WITH DIFFERENTIAL/PLATELET
Basophils Absolute: 0.1 10*3/uL (ref 0–0.1)
Basophils Relative: 1 %
EOS PCT: 1 %
Eosinophils Absolute: 0.1 10*3/uL (ref 0–0.7)
HCT: 28.5 % — ABNORMAL LOW (ref 35.0–47.0)
Hemoglobin: 9.8 g/dL — ABNORMAL LOW (ref 12.0–16.0)
LYMPHS ABS: 1.6 10*3/uL (ref 1.0–3.6)
LYMPHS PCT: 18 %
MCH: 30.2 pg (ref 26.0–34.0)
MCHC: 34.5 g/dL (ref 32.0–36.0)
MCV: 87.4 fL (ref 80.0–100.0)
MONO ABS: 1.1 10*3/uL — AB (ref 0.2–0.9)
MONOS PCT: 13 %
Neutro Abs: 6 10*3/uL (ref 1.4–6.5)
Neutrophils Relative %: 67 %
Platelets: 351 10*3/uL (ref 150–440)
RBC: 3.26 MIL/uL — ABNORMAL LOW (ref 3.80–5.20)
RDW: 16.8 % — AB (ref 11.5–14.5)
WBC: 8.9 10*3/uL (ref 3.6–11.0)

## 2018-03-05 LAB — PROTIME-INR
INR: >10
Prothrombin Time: >90 seconds — ABNORMAL HIGH (ref 11.4–15.2)

## 2018-03-05 LAB — APTT: aPTT: 116 seconds — ABNORMAL HIGH (ref 24–36)

## 2018-03-05 MED ORDER — ONDANSETRON HCL 4 MG PO TABS: 4.0000 mg | ORAL_TABLET | Freq: Four times a day (QID) | ORAL | Status: DC | PRN

## 2018-03-05 MED ORDER — FUROSEMIDE 40 MG PO TABS
40.0000 mg | ORAL_TABLET | Freq: Every day | ORAL | Status: DC
  Administered 2018-03-06: 40 mg via ORAL
  Filled 2018-03-05 (×2): qty 1

## 2018-03-05 MED ORDER — INFLUENZA VAC SPLIT HIGH-DOSE 0.5 ML IM SUSY
0.5000 mL | PREFILLED_SYRINGE | INTRAMUSCULAR | Status: AC
  Administered 2018-03-06: 0.5 mL via INTRAMUSCULAR
  Filled 2018-03-05: qty 0.5

## 2018-03-05 MED ORDER — ONDANSETRON HCL 4 MG/2ML IJ SOLN: 4.0000 mg | Freq: Four times a day (QID) | INTRAMUSCULAR | Status: DC | PRN

## 2018-03-05 MED ORDER — PNEUMOCOCCAL VAC POLYVALENT 25 MCG/0.5ML IJ INJ
0.5000 mL | INJECTION | INTRAMUSCULAR | Status: AC
  Administered 2018-03-06: 10:00:00 0.5 mL via INTRAMUSCULAR
  Filled 2018-03-05: qty 0.5

## 2018-03-05 MED ORDER — SODIUM CHLORIDE 0.9 % IV SOLN
Freq: Once | INTRAVENOUS | Status: AC
  Administered 2018-03-05: 17:00:00 via INTRAVENOUS

## 2018-03-05 MED ORDER — ACETAMINOPHEN ER 650 MG PO TBCR: 650.0000 mg | EXTENDED_RELEASE_TABLET | Freq: Four times a day (QID) | ORAL | Status: DC | PRN

## 2018-03-05 MED ORDER — QUETIAPINE FUMARATE 25 MG PO TABS
50.0000 mg | ORAL_TABLET | Freq: Every morning | ORAL | Status: DC
  Filled 2018-03-05: qty 2

## 2018-03-05 MED ORDER — NITROGLYCERIN 0.4 MG SL SUBL: 0.4000 mg | SUBLINGUAL_TABLET | SUBLINGUAL | Status: DC | PRN

## 2018-03-05 MED ORDER — FERROUS SULFATE 325 (65 FE) MG PO TABS
325.0000 mg | ORAL_TABLET | Freq: Every day | ORAL | Status: DC
  Administered 2018-03-06 – 2018-03-07 (×2): 325 mg via ORAL
  Filled 2018-03-05 (×2): qty 1

## 2018-03-05 MED ORDER — DOCUSATE SODIUM 100 MG PO CAPS
100.0000 mg | ORAL_CAPSULE | Freq: Two times a day (BID) | ORAL | Status: DC
  Administered 2018-03-05 – 2018-03-07 (×4): 100 mg via ORAL
  Filled 2018-03-05 (×4): qty 1

## 2018-03-05 MED ORDER — POLYETHYLENE GLYCOL 3350 17 G PO PACK: 17.0000 g | PACK | Freq: Every day | ORAL | Status: DC | PRN

## 2018-03-05 MED ORDER — PRO-STAT SUGAR FREE PO LIQD
15.0000 mL | Freq: Two times a day (BID) | ORAL | Status: DC
  Administered 2018-03-06 – 2018-03-07 (×3): 15 mL via ORAL

## 2018-03-05 MED ORDER — ACETAMINOPHEN 325 MG PO TABS: 650.0000 mg | ORAL_TABLET | Freq: Four times a day (QID) | ORAL | Status: DC | PRN

## 2018-03-05 MED ORDER — TRAMADOL HCL 50 MG PO TABS
50.0000 mg | ORAL_TABLET | Freq: Four times a day (QID) | ORAL | Status: DC | PRN
  Administered 2018-03-06: 50 mg via ORAL
  Filled 2018-03-05: qty 1

## 2018-03-05 MED ORDER — QUETIAPINE FUMARATE 25 MG PO TABS
150.0000 mg | ORAL_TABLET | Freq: Every day | ORAL | Status: DC
  Administered 2018-03-05: 22:00:00 100 mg via ORAL
  Filled 2018-03-05: qty 6

## 2018-03-05 MED ORDER — DOXYCYCLINE HYCLATE 100 MG PO TABS
100.0000 mg | ORAL_TABLET | Freq: Two times a day (BID) | ORAL | Status: DC
  Administered 2018-03-05 – 2018-03-07 (×4): 100 mg via ORAL
  Filled 2018-03-05 (×6): qty 1

## 2018-03-05 MED ORDER — ATORVASTATIN CALCIUM 20 MG PO TABS
80.0000 mg | ORAL_TABLET | Freq: Every day | ORAL | Status: DC
  Administered 2018-03-06 – 2018-03-07 (×2): 80 mg via ORAL
  Filled 2018-03-05 (×2): qty 4

## 2018-03-05 MED ORDER — SENNA 8.6 MG PO TABS
1.0000 | ORAL_TABLET | Freq: Two times a day (BID) | ORAL | Status: DC
  Administered 2018-03-05 – 2018-03-07 (×4): 8.6 mg via ORAL
  Filled 2018-03-05 (×4): qty 1

## 2018-03-05 MED ORDER — VITAMIN K1 10 MG/ML IJ SOLN
5.0000 mg | Freq: Once | INTRAVENOUS | Status: AC
  Administered 2018-03-05: 5 mg via INTRAVENOUS
  Filled 2018-03-05: qty 0.5

## 2018-03-05 MED ORDER — AMLODIPINE BESYLATE 5 MG PO TABS
5.0000 mg | ORAL_TABLET | Freq: Every day | ORAL | Status: DC
  Administered 2018-03-06 – 2018-03-07 (×2): 5 mg via ORAL
  Filled 2018-03-05 (×2): qty 1

## 2018-03-05 MED ORDER — METOPROLOL TARTRATE 50 MG PO TABS
50.0000 mg | ORAL_TABLET | Freq: Two times a day (BID) | ORAL | Status: DC
  Administered 2018-03-05 – 2018-03-07 (×4): 50 mg via ORAL
  Filled 2018-03-05 (×4): qty 1

## 2018-03-05 NOTE — ED Notes (Signed)
Asst w/ rectal exam. Hemoccult negative per EDP

## 2018-03-05 NOTE — ED Notes (Signed)
Transport to floor room 125.AS 

## 2018-03-05 NOTE — H&P (Signed)
[place] at [facility] NAME: Ashley Mullins    MR#:  [id number]  DATE OF BIRTH:  08-04-1928  DATE OF ADMISSION:  03/05/2018  PRIMARY CARE PHYSICIAN: Juluis Pitch, MD   REQUESTING/REFERRING PHYSICIAN: Conni Slipper, MD  CHIEF COMPLAINT:   Chief Complaint  Patient presents with  . Abnormal Lab    HISTORY OF PRESENT ILLNESS:  Ashley Mullins  is a 84 y.o. female with a known history of chronic atrial fibrillation (previously on coumadin), hx of complete heart block s/p dual-chamber pacemaker, CAD, HLD, depression, CKD III and dementia who presented to the ED from [facility] due to supratherapeutic INR. She was recently admitted from 8/25-8/28 with a right hip fracture after a mechanical fall. She underwent intramedullary nailing of the right femur on 02/08/18. Her post-op course was complicated by surgical wound infection. She was started on Bactrim on 02/24/18. She has had supratherapeutic INRs starting 9/5. Her INR has been steadily increasing from 5.91 to >8.5 on 9/17, 9/18, and 9/19. Her last dose of warfarin was 9/5. She has not had bleeding. Since her hip fracture, she has been overall declining. She has not been walking or eating much, but is still drinking. She slid out of her wheelchair and fell on her bottom yesterday. She has not been complaining of any pain since then.  In the ED, INR was >10, APTT 116, Cr 1.67 (close to baseline), AST and ALT were normal, alk phos elevated at 238, and hemoglobin was 9.8 (at baseline). She was given vitamin K 34m x 1. Hospitalists were called for admission.  PAST MEDICAL HISTORY:   Past Medical History:  Diagnosis Date  . A-fib ([place]   . CAD (coronary artery disease)   . Dementia   . Depression   . Hyperlipidemia   . Irregular heart beat   . Pacemaker     PAST SURGICAL HISTORY:   Past Surgical History:  Procedure Laterality Date  . BACK SURGERY    . INTRAMEDULLARY (IM) NAIL INTERTROCHANTERIC Right  02/08/2018   Procedure: INTRAMEDULLARY (IM) NAIL INTERTROCHANTRIC;  Surgeon: PLeim Fabry MD;  Location: [facility] ORS;  Service: Orthopedics;  Laterality: Right;  . PACEMAKER INSERTION    . VAGINAL HYSTERECTOMY      SOCIAL HISTORY:   Social History   Tobacco Use  . Smoking status: Former SResearch scientist (life sciences) . Smokeless tobacco: Never Used  Substance Use Topics  . Alcohol use: No    FAMILY HISTORY:  No family history on file.  DRUG ALLERGIES:   Allergies  Allergen Reactions  . Chicken Allergy   . Codeine   . Other     Pt is allergic to [place] chicken    REVIEW OF SYSTEMS:   ROS- unable to obtain due to dementia and confusion  MEDICATIONS AT HOME:   Prior to Admission medications   Medication Sig Start Date End Date Taking? Authorizing Provider  acetaminophen (TYLENOL) 650 MG CR tablet Take 650 mg by mouth every 6 (six) hours as needed for pain.    Yes [provider]  Amino Acids-Protein Hydrolys (FEEDING SUPPLEMENT, PRO-STAT SUGAR FREE 64,) LIQD Take 15 mLs by mouth 2 (two) times daily.   Yes [provider]  amLODipine (NORVASC) 5 MG tablet Take 5 mg by mouth daily. 08/05/16  Yes [provider]  atorvastatin (LIPITOR) 80 MG tablet Take 80 mg by mouth daily. 08/28/16  Yes [provider]  docusate sodium (COLACE) 100 MG capsule Take 1 capsule (  100 mg total) by mouth 2 (two) times daily. 02/10/18  Yes Gladstone Lighter, MD  ferrous sulfate 325 (65 FE) MG tablet Take 325 mg by mouth daily with breakfast.   Yes [provider]  furosemide (LASIX) 40 MG tablet Take 40 mg by mouth daily.   Yes [provider]  metoprolol (LOPRESSOR) 50 MG tablet Take 50 mg by mouth 2 (two) times daily.  07/15/16  Yes [provider]  nitroGLYCERIN (NITROSTAT) 0.4 MG SL tablet Place 0.4 mg under the tongue every 5 (five) minutes as needed for chest pain.   Yes [provider]  OLANZapine (ZYPREXA) 2.5 MG tablet Take 0.5 tablets (1.25  mg total) by mouth 3 (three) times daily with meals. Patient taking differently: Take 1.25 mg by mouth daily.  02/10/18 04/11/18 Yes Gladstone Lighter, MD  polyethylene glycol (MIRALAX / GLYCOLAX) packet Take 17 g by mouth daily as needed for mild constipation. 02/10/18  Yes Gladstone Lighter, MD  potassium chloride (K-DUR,KLOR-CON) 10 MEQ tablet Take 10 mEq by mouth daily.   Yes [provider]  QUEtiapine (SEROQUEL) 50 MG tablet Take 1-3 tablets (50-150 mg total) by mouth daily. Take 50 mg by mouth in the morning and 150 mg by mouth at bedtime. 02/10/18  Yes Gladstone Lighter, MD  senna (SENOKOT) 8.6 MG TABS tablet Take 1 tablet by mouth 2 (two) times daily.   Yes [provider]  sulfamethoxazole-trimethoprim (BACTRIM DS,SEPTRA DS) 800-160 MG tablet Take 1 tablet by mouth 2 (two) times daily. 02/25/18 03/09/18 Yes [provider]  traMADol (ULTRAM) 50 MG tablet Take 1 tablet (50 mg total) by mouth every 6 (six) hours as needed for moderate pain. 02/10/18  Yes Reche Dixon, PA-C  warfarin (COUMADIN) 2 MG tablet Take 1 tablet (2 mg total) by mouth daily. Patient not taking: Reported on 03/05/2018 02/10/18   Gladstone Lighter, MD      VITAL SIGNS:  Blood pressure 135/90, pulse (!) 108, temperature 98.3 F (36.8 C), temperature source Oral, resp. rate 18, height _0  (1.6 m), weight 68 kg, SpO2 98 %.  PHYSICAL EXAMINATION:  Physical Exam  GENERAL:  84 y.o.-year-old patient lying in the bed with no acute distress.  EYES: Pupils equal, round, reactive to light and accommodation. No scleral icterus. Extraocular muscles intact.  HEENT: Head atraumatic, normocephalic. Oropharynx and nasopharynx clear.  NECK:  Supple, no jugular venous distention. No thyroid enlargement, no tenderness.  LUNGS: Normal breath sounds bilaterally, no wheezing, rales, rhonchi or crepitation. No use of accessory muscles of respiration.  CARDIOVASCULAR: Tachycardic, irregularly irregular rhythm, S1,  S2 normal. No murmurs, rubs, or gallops.  ABDOMEN: Soft, nontender, nondistended. Bowel sounds present. No organomegaly or mass.  EXTREMITIES: No pedal edema, cyanosis, or clubbing.  NEUROLOGIC: Cranial nerves II through XII are intact. Muscle strength 5/5 in all extremities. Sensation intact. Gait not checked.  PSYCHIATRIC: The patient is alert and oriented x 3.  SKIN: No obvious rash, lesion, or ulcer.   LABORATORY PANEL:   CBC Recent Labs  Lab 03/05/18 1509  WBC 8.9  HGB 9.8*  HCT 28.5*  PLT 351   ------------------------------------------------------------------------------------------------------------------  Chemistries  Recent Labs  Lab 03/05/18 1509  NA 136  K 4.8  CL 103  CO2 25  GLUCOSE 123*  BUN 31*  CREATININE 1.67*  CALCIUM 8.2*  AST 45*  ALT 30  ALKPHOS 238*  BILITOT 0.9   ------------------------------------------------------------------------------------------------------------------  Cardiac Enzymes Recent Labs  Lab 03/05/18 1541  TROPONINI <0.03   ------------------------------------------------------------------------------------------------------------------  RADIOLOGY:  Dg Chest 2 View  Result Date: 03/05/2018 CLINICAL DATA:  Weakness EXAM: CHEST - 2 VIEW COMPARISON:  02/07/2018 FINDINGS: Chronic cardiomegaly. Mitral annular ossification. Dual-chamber pacer leads from the left are in stable position. There is no edema, consolidation, effusion, or pneumothorax. Prominent osteopenia. IMPRESSION: No evidence of active disease. Electronically Signed   By: Monte Fantasia M.D.   On: 03/05/2018 16:17   Ct Head Wo Contrast  Result Date: 03/05/2018 CLINICAL DATA:  Abnormal labs recent right hip surgery, confusion EXAM: CT HEAD WITHOUT CONTRAST TECHNIQUE: Contiguous axial images were obtained from the base of the skull through the vertex without intravenous contrast. COMPARISON:  CT brain 08/29/2016 FINDINGS: Brain: No acute territorial infarction,  hemorrhage or intracranial mass. Atrophy with mild small vessel ischemic changes of the white matter. Stable ventricle size. Vascular: No hyperdense vessels. Carotid vascular and vertebral artery calcification Skull: Normal. Negative for fracture or focal lesion. Sinuses/Orbits: No acute finding. Other: None IMPRESSION: 1. No CT evidence for acute intracranial abnormality. 2. Atrophy with small vessel ischemic changes of the white matter Electronically Signed   By: Donavan Foil M.D.   On: 03/05/2018 16:14      IMPRESSION AND PLAN:   Supratherapeutic INR- INR >10 in the ED and has been high at SNF since 9/5. Has not received coumadin since 9/5. May be supratherapeutic due to a combination of her CKD and bactrim (has been on this for wound infection). AST/ALT are normal and no history of liver issues. SNF confirms that she has not continued to receive coumadin. No signs of active bleeding and hgb is stable. - s/p vitamin K 65m IV x 1 in the ED - recheck INR in the morning, will likely need additional doses of vitamin K - hold off on FFP for now without active bleeding - stop bactrim and switch to doxycycline  Recent hip fracture s/p intramedullary nailing 8/26 with post-op wound infection- still having some clear drainage on exam today, but no obvious infection. Right hip x-ray without any obvious new fractures. - change bactrim to doxycycline  Elevated alkaline phosphatase- more likely coming from bone due to recent orthopedic surgery, less likely coming from the liver - check GGT  Chronic atrial fibrillation- mildly tachycardic here. - holding coumadin - continue metoprolol   Hypertension- normotensive in the ED - continue home norvasc  Hyperlipidemia- stable - continue home lipitor  CKD III- Cr 1.67 (baseline 1.3-1.5) - avoid nephrotoxic agents - recheck cmp in the morning  Iron deficiency anemia- hgb at baseline - continue ferrous sulfate  Dementia- has worsening confusion at  nighttime. Has zyprexa and seroquel listed as current meds but daughter [facility] - continue seroquel  - stop zyprexa   All the records are reviewed and case discussed with ED provider. Management plans discussed with the patient, family and they are in agreement.  CODE STATUS: DNR  TOTAL TIME TAKING CARE OF THIS PATIENT: 45 minutes.    KBerna SpareMayo M.D on 03/05/2018 at 6:36 PM  Between 7am to 6pm - Pager - [phone] After 6pm go to [email or web address] - [email or web address] [facility]  Office  [phone] CC: Primary care physician; BJuluis Pitch MD   Note: This dictation was prepared with Dragon dictation along with smaller phrase technology. Any transcriptional errors that result from this process are unintentional.

## 2018-03-05 NOTE — Progress Notes (Signed)
Family Meeting Note  Advance Directive:yes  Today a meeting took place with the Patient and daughter.  Patient is unable to participate due ZO:[id number]:Lacked capacity dementia and confusion   The following clinical team members were present during this meeting:MD  The following were discussed:Patient's diagnosis: , Patient's progosis: Unable to determine and Goals for treatment: DNR  Additional follow-up to be provided: prn  Time spent during discussion:20 minutes  Hilton SinclairKaty D Livana Yerian, MD

## 2018-03-05 NOTE — ED Notes (Signed)
Patient transported to X-ray 

## 2018-03-05 NOTE — ED Provider Notes (Signed)
[facility] Emergency Department Provider Note   ____________________________________________   First MD Initiated Contact with Patient 03/05/18 1506     (approximate)  I have reviewed the triage vital signs and the nursing notes.   HISTORY  Chief Complaint Abnormal Lab    HPI Ashley Mullins is a 84 y.o. female patient fell and broke her hip and had a hip replacement surgery done almost a month ago.  She went to [facility] after that.  She has not been eating much less week or 2.  She is not drinking.  She is not walking not participating in her physical therapy and family reports she is very slow to respond.  They have been decreasing her Seroquel and Zyprexa.  She is now getting rambunctious at nighttime but still confused and talking out of her head during the day.  She was sent here because her INR has been trending upward is not greater than 10.  She does not appear to be having any bleeding anywhere.  She was taken off her Coumadin several days ago.   Past Medical History:  Diagnosis Date  . A-fib ([place])   . CAD (coronary artery disease)   . Dementia   . Depression   . Hyperlipidemia   . Irregular heart beat   . Pacemaker     Patient Active Problem List   Diagnosis Date Noted  . Closed right hip fracture ([place]) 02/07/2018  . Dementia with psychosis 08/29/2016    Past Surgical History:  Procedure Laterality Date  . BACK SURGERY    . INTRAMEDULLARY (IM) NAIL INTERTROCHANTERIC Right 02/08/2018   Procedure: INTRAMEDULLARY (IM) NAIL INTERTROCHANTRIC;  Surgeon: Leim Fabry, MD;  Location: [facility] ORS;  Service: Orthopedics;  Laterality: Right;  . PACEMAKER INSERTION    . VAGINAL HYSTERECTOMY      Prior to Admission medications   Medication Sig Start Date End Date Taking? Authorizing Provider  acetaminophen (TYLENOL) 650 MG CR tablet Take 650 mg by mouth every 6 (six) hours as needed for pain.    Yes [provider]  Amino  Acids-Protein Hydrolys (FEEDING SUPPLEMENT, PRO-STAT SUGAR FREE 64,) LIQD Take 15 mLs by mouth 2 (two) times daily.   Yes [provider]  amLODipine (NORVASC) 5 MG tablet Take 5 mg by mouth daily. 08/05/16  Yes [provider]  atorvastatin (LIPITOR) 80 MG tablet Take 80 mg by mouth daily. 08/28/16  Yes [provider]  docusate sodium (COLACE) 100 MG capsule Take 1 capsule (100 mg total) by mouth 2 (two) times daily. 02/10/18  Yes Gladstone Lighter, MD  ferrous sulfate 325 (65 FE) MG tablet Take 325 mg by mouth daily with breakfast.   Yes [provider]  furosemide (LASIX) 40 MG tablet Take 40 mg by mouth daily.   Yes [provider]  metoprolol (LOPRESSOR) 50 MG tablet Take 50 mg by mouth 2 (two) times daily.  07/15/16  Yes [provider]  nitroGLYCERIN (NITROSTAT) 0.4 MG SL tablet Place 0.4 mg under the tongue every 5 (five) minutes as needed for chest pain.   Yes [provider]  OLANZapine (ZYPREXA) 2.5 MG tablet Take 0.5 tablets (1.25 mg total) by mouth 3 (three) times daily with meals. Patient taking differently: Take 1.25 mg by mouth daily.  02/10/18 04/11/18 Yes Gladstone Lighter, MD  polyethylene glycol (MIRALAX / GLYCOLAX) packet Take 17 g by mouth daily as needed for mild constipation. 02/10/18  Yes Gladstone Lighter, MD  potassium chloride (K-DUR,KLOR-CON)  10 MEQ tablet Take 10 mEq by mouth daily.   Yes [provider]  QUEtiapine (SEROQUEL) 50 MG tablet Take 1-3 tablets (50-150 mg total) by mouth daily. Take 50 mg by mouth in the morning and 150 mg by mouth at bedtime. 02/10/18  Yes Gladstone Lighter, MD  senna (SENOKOT) 8.6 MG TABS tablet Take 1 tablet by mouth 2 (two) times daily.   Yes [provider]  sulfamethoxazole-trimethoprim (BACTRIM DS,SEPTRA DS) 800-160 MG tablet Take 1 tablet by mouth 2 (two) times daily. 02/25/18 03/09/18 Yes [provider]  traMADol (ULTRAM) 50 MG tablet Take 1 tablet  (50 mg total) by mouth every 6 (six) hours as needed for moderate pain. 02/10/18  Yes Reche Dixon, PA-C  warfarin (COUMADIN) 2 MG tablet Take 1 tablet (2 mg total) by mouth daily. Patient not taking: Reported on 03/05/2018 02/10/18   Gladstone Lighter, MD    Allergies Chicken allergy; Codeine; and Other  No family history on file.  Social History Social History   Tobacco Use  . Smoking status: Former Research scientist (life sciences)  . Smokeless tobacco: Never Used  Substance Use Topics  . Alcohol use: No  . Drug use: No    Review of Systems  Constitutional: No fever/chills Eyes: No visual changes. ENT: No sore throat. Cardiovascular: Denies chest pain. Respiratory: Denies shortness of breath. Gastrointestinal: No abdominal pain.  No nausea, no vomiting.  No diarrhea.  No constipation. Genitourinary: Negative for dysuria. Musculoskeletal: Negative for back pain. Skin: Negative for rash. Neurological: Negative for headaches, focal weakness ____________________________________________   PHYSICAL EXAM:  VITAL SIGNS: ED Triage Vitals [03/05/18 1451]  Enc Vitals Group     BP 135/90     Pulse Rate (!) 108     Resp 18     Temp 98.3 F (36.8 C)     Temp Source Oral     SpO2 98 %     Weight 150 lb (68 kg)     Height _0  (1.6 m)     Head Circumference      [facility] Flow      Pain Score      Pain Loc      Pain Edu?      Excl. in [place]?     Constitutional: Alert and oriented to person and hospital otherwise she is confused. Well appearing and in no acute distress. Eyes: Conjunctivae are normal. Head: Atraumatic. Nose: No congestion/rhinnorhea. Mouth/Throat: Mucous membranes are moist.  Oropharynx non-erythematous. Neck: No stridor.  Cardiovascular: Normal rate, regular rhythm. Grossly normal heart sounds.  Good peripheral circulation. Respiratory: Normal respiratory effort.  No retractions. Lungs CTAB. Gastrointestinal: Soft and nontender. No distention. No abdominal bruits. No CVA  tenderness. Musculoskeletal: No lower extremity tenderness nor edema.   Neurologic:  Normal speech and language. No gross focal neurologic deficits are appreciated.  Skin:  Skin is warm, dry and intact. No rash noted. Psychiatric: Mood and affect are normal.  Patient however is confused and talking about they can push the staples into me and I will be out to go home for Christmas.  Please note is now September.  ____________________________________________   LABS (all labs ordered are listed, but only abnormal results are displayed)  Labs Reviewed  COMPREHENSIVE METABOLIC PANEL - Abnormal; Notable for the following components:      Result Value   Glucose, Bld 123 (*)    BUN 31 (*)    Creatinine, Ser 1.67 (*)    Calcium 8.2 (*)    Albumin  2.8 (*)    AST 45 (*)    Alkaline Phosphatase 238 (*)    GFR calc non Af Amer 26 (*)    GFR calc Af Amer 31 (*)    All other components within normal limits  CBC WITH DIFFERENTIAL/PLATELET - Abnormal; Notable for the following components:   RBC 3.26 (*)    Hemoglobin 9.8 (*)    HCT 28.5 (*)    RDW 16.8 (*)    Monocytes Absolute 1.1 (*)    All other components within normal limits  APTT - Abnormal; Notable for the following components:   aPTT 116 (*)    All other components within normal limits  PROTIME-INR - Abnormal; Notable for the following components:   Prothrombin Time >90.0 (*)    INR >10.00 (*)    All other components within normal limits  URINALYSIS, COMPLETE (UACMP) WITH MICROSCOPIC - Abnormal; Notable for the following components:   Color, Urine YELLOW (*)    APPearance CLEAR (*)    Hgb urine dipstick SMALL (*)    Leukocytes, UA TRACE (*)    All other components within normal limits  TROPONIN I   ____________________________________________  EKG   ____________________________________________  RADIOLOGY  ED MD interpretation: CT of the head read by radiology is no acute disease.  Chest x-ray read by radiology reviewed  by me shows no acute disease.  Hip x-rays read by radiology and reviewed in great detail by me discussed with the x-ray tech as well show that there could be some shortening of the femoral neck although the positioning is not the same as it was on previous films.  This is due to the fact that the patient really cannot move that hip that well.  Official radiology report(s): Dg Chest 2 View  Result Date: 03/05/2018 CLINICAL DATA:  Weakness EXAM: CHEST - 2 VIEW COMPARISON:  02/07/2018 FINDINGS: Chronic cardiomegaly. Mitral annular ossification. Dual-chamber pacer leads from the left are in stable position. There is no edema, consolidation, effusion, or pneumothorax. Prominent osteopenia. IMPRESSION: No evidence of active disease. Electronically Signed   By: Monte Fantasia M.D.   On: 03/05/2018 16:17   Ct Head Wo Contrast  Result Date: 03/05/2018 CLINICAL DATA:  Abnormal labs recent right hip surgery, confusion EXAM: CT HEAD WITHOUT CONTRAST TECHNIQUE: Contiguous axial images were obtained from the base of the skull through the vertex without intravenous contrast. COMPARISON:  CT brain 08/29/2016 FINDINGS: Brain: No acute territorial infarction, hemorrhage or intracranial mass. Atrophy with mild small vessel ischemic changes of the white matter. Stable ventricle size. Vascular: No hyperdense vessels. Carotid vascular and vertebral artery calcification Skull: Normal. Negative for fracture or focal lesion. Sinuses/Orbits: No acute finding. Other: None IMPRESSION: 1. No CT evidence for acute intracranial abnormality. 2. Atrophy with small vessel ischemic changes of the white matter Electronically Signed   By: Donavan Foil M.D.   On: 03/05/2018 16:14   Dg Hip Unilat W Or Wo Pelvis 2-3 Views Right  Result Date: 03/05/2018 CLINICAL DATA:  Fall from wheelchair right hip pain recent surgery EXAM: DG HIP (WITH OR WITHOUT PELVIS) 2-3V RIGHT COMPARISON:  02/08/2018, 02/07/2018 FINDINGS: Interval intramedullary rod  and screw fixation of fractures involving right femoral neck and trochanter. As compared with fluoroscopic images from 02/08/2018, the right femoral neck appears more impacted/foreshortened, is unclear how much of this is related to positioning. No cortical breach of the fixating device is at the femoral head. There may be a healing fracture of the right superior  pubic ramus. The distal femoral rod and screws appear intact. IMPRESSION: Status post intramedullary rod and screw fixation of right femoral neck and trochanter fracture. As compared with intraoperative views, the right femoral neck appears more impacted and foreshortened, it is uncertain how much of this is related to positioning. Electronically Signed   By: Donavan Foil M.D.   On: 03/05/2018 18:52    ____________________________________________   PROCEDURES  Procedure(s) performed:   Procedures  Critical Care performed:   ____________________________________________   INITIAL IMPRESSION / ASSESSMENT AND PLAN / ED COURSE  Patient has progressive altered mental status per family.  She is slightly tachycardic.  Stool is Hemoccult negative.  She is not running a fever lab work is essentially within normal limits except for the elevated alk phos which I believe is likely from the surgery still.  And of course the elevated INR.  Since patient is not taking solids p.o. well I will give her some IV vitamin K.  Coumadin has been held for the last 15 days without any improvement in fact with worsening.  I will talk to the hospitalist about watching her.    Clinical Course as of Mar 05 1901  Fri Mar 05, 2018  1853 Leukocytes, UA(!): TRACE [PM]    Clinical Course User Index [PM] Nena Polio, MD     ____________________________________________   FINAL CLINICAL IMPRESSION(S) / ED DIAGNOSES  Final diagnoses:  Altered mental status, unspecified altered mental status type  Inanition ([place])  Elevated INR     ED Discharge  Orders    None       Note:  This document was prepared using Dragon voice recognition software and may include unintentional dictation errors.    Nena Polio, MD 03/05/18 1902

## 2018-03-05 NOTE — ED Triage Notes (Signed)
To ER via EMS from [facility] for abnormal labs, state that clotting factors are abnormal. Pt had recent right hip surgery, staying at [facility] for rehab. Daughter at bedside, reports confusion "for a while".   Pt alert to self, disoriented to place, situation and time.

## 2018-03-05 NOTE — ED Notes (Signed)
Report to Rachel, RN.

## 2018-03-05 NOTE — ED Notes (Signed)
Warfarin did not appear on medication orders from [facility] but did appear as prescribed about 02/10/2018 for a 30 day supply and 2 refills. I contacted [facility] and spoke with unit nurse Chrissy who reported patient had not taken warfarin since 02/18/2018 when her INR reading was 5.91. Chrissy went on to report the following INR readings:  9/5:   5.91 9/7:   5.69 9/10: 4.51 9/17: >8.5 9/18: >8.5 9/19: >8.5  PTT: 79.7  I indicated that these data meant little to me personally but promised to include the information for the treatment team to review. Chrissy said to contact her at [facility] if copies of the lab reports required.  ** The above is intended solely for informational and/or communicative purposes. It should in no way be considered an endorsement of any specific treatment, therapy or action. **

## 2018-03-05 NOTE — ED Notes (Signed)
Patient transported to CT 

## 2018-03-05 NOTE — ED Notes (Signed)
Date and time results received: 03/05/18 3:47 PM  (use smartphrase ".now" to insert current time)  Test: inr Critical Value:>10  Name of Provider Notified: Malinda  Orders Received? Or Actions Taken?: No new orders at this time

## 2018-03-06 DIAGNOSIS — R791 Abnormal coagulation profile: Secondary | ICD-10-CM | POA: Diagnosis not present

## 2018-03-06 LAB — COMPREHENSIVE METABOLIC PANEL
ALBUMIN: 2.5 g/dL — AB (ref 3.5–5.0)
ALK PHOS: 208 U/L — AB (ref 38–126)
ALT: 26 U/L (ref 0–44)
ANION GAP: 6 (ref 5–15)
AST: 37 U/L (ref 15–41)
BILIRUBIN TOTAL: 1 mg/dL (ref 0.3–1.2)
BUN: 27 mg/dL — ABNORMAL HIGH (ref 8–23)
CO2: 25 mmol/L (ref 22–32)
CREATININE: 1.34 mg/dL — AB (ref 0.44–1.00)
Calcium: 8.1 mg/dL — ABNORMAL LOW (ref 8.9–10.3)
Chloride: 107 mmol/L (ref 98–111)
GFR calc Af Amer: 40 mL/min — ABNORMAL LOW (ref >60)
GFR calc non Af Amer: 35 mL/min — ABNORMAL LOW (ref >60)
GLUCOSE: 140 mg/dL — AB (ref 70–99)
Potassium: 3.9 mmol/L (ref 3.5–5.1)
Sodium: 138 mmol/L (ref 135–145)
TOTAL PROTEIN: 6.2 g/dL — AB (ref 6.5–8.1)

## 2018-03-06 LAB — CBC
HCT: 26 % — ABNORMAL LOW (ref 35.0–47.0)
Hemoglobin: 8.9 g/dL — ABNORMAL LOW (ref 12.0–16.0)
MCH: 30.1 pg (ref 26.0–34.0)
MCHC: 34.1 g/dL (ref 32.0–36.0)
MCV: 88.3 fL (ref 80.0–100.0)
PLATELETS: 288 10*3/uL (ref 150–440)
RBC: 2.94 MIL/uL — ABNORMAL LOW (ref 3.80–5.20)
RDW: 16.8 % — AB (ref 11.5–14.5)
WBC: 7.2 10*3/uL (ref 3.6–11.0)

## 2018-03-06 LAB — PROTIME-INR
INR: 1.76
Prothrombin Time: 20.4 s — ABNORMAL HIGH (ref 11.4–15.2)

## 2018-03-06 LAB — GAMMA GT: GGT: 23 U/L (ref 7–50)

## 2018-03-06 LAB — APTT: aPTT: 45 seconds — ABNORMAL HIGH (ref 24–36)

## 2018-03-06 MED ORDER — QUETIAPINE FUMARATE 100 MG PO TABS
100.0000 mg | ORAL_TABLET | Freq: Every day | ORAL | Status: DC
  Filled 2018-03-06 (×2): qty 1

## 2018-03-06 NOTE — Plan of Care (Signed)

## 2018-03-06 NOTE — Progress Notes (Signed)
[facility] [place] [facility]   PATIENT NAME: Ashley Mullins    MR#:  [id number]  DATE OF BIRTH:  06-22-1928  SUBJECTIVE:   Patient presented to the hospital secondary to a supratherapeutic INR.  No acute bleeding noted overnight.  Hemoglobin is stable.  INR is normal today after patient received some vitamin K.  Patient somewhat lethargic but arousable and follows simple commands.  REVIEW OF SYSTEMS:    Review of Systems  Unable to perform ROS: Mental acuity    Nutrition: Regular Tolerating Diet: yes Tolerating PT: Await Eval.   DRUG ALLERGIES:   Allergies  Allergen Reactions  . Chicken Allergy   . Codeine   . Ibuprofen Other (See Comments)  . Other     Pt is allergic to [place] and chicken    VITALS:  Blood pressure 113/80, pulse 85, temperature 98.6 F (37 C), resp. rate 16, height 5\' 4"  (1.626 m), weight 62.1 kg, SpO2 100 %.  PHYSICAL EXAMINATION:   Physical Exam  GENERAL:  84 y.o.-year-old patient lying in bed lethargic but in NAD.    EYES: Pupils equal, round, reactive to light and accommodation. No scleral icterus. Extraocular muscles intact.  HEENT: Head atraumatic, normocephalic. Oropharynx and nasopharynx clear. Dry Oral Mucosa.  NECK:  Supple, no jugular venous distention. No thyroid enlargement, no tenderness.  LUNGS: Normal breath sounds bilaterally, no wheezing, rales, rhonchi. No use of accessory muscles of respiration.  CARDIOVASCULAR: S1, S2 normal. No murmurs, rubs, or gallops.  ABDOMEN: Soft, nontender, nondistended. Bowel sounds present. No organomegaly or mass.  EXTREMITIES: No cyanosis, clubbing or edema b/l.    NEUROLOGIC: Cranial nerves II through XII are intact. No focal Motor or sensory deficits b/l. Globally weak.    PSYCHIATRIC: The patient is alert and oriented x 1.  SKIN: No obvious rash, lesion, or ulcer.    LABORATORY PANEL:   CBC Recent Labs  Lab 03/06/18 0457  WBC 7.2  HGB 8.9*  HCT 26.0*  PLT 288    ------------------------------------------------------------------------------------------------------------------  Chemistries  Recent Labs  Lab 03/06/18 0457  NA 138  K 3.9  CL 107  CO2 25  GLUCOSE 140*  BUN 27*  CREATININE 1.34*  CALCIUM 8.1*  AST 37  ALT 26  ALKPHOS 208*  BILITOT 1.0   ------------------------------------------------------------------------------------------------------------------  Cardiac Enzymes Recent Labs  Lab 03/05/18 1541  TROPONINI <0.03   ------------------------------------------------------------------------------------------------------------------  RADIOLOGY:  Dg Chest 2 View  Result Date: 03/05/2018 CLINICAL DATA:  Weakness EXAM: CHEST - 2 VIEW COMPARISON:  02/07/2018 FINDINGS: Chronic cardiomegaly. Mitral annular ossification. Dual-chamber pacer leads from the left are in stable position. There is no edema, consolidation, effusion, or pneumothorax. Prominent osteopenia. IMPRESSION: No evidence of active disease. Electronically Signed   By: Marnee Spring M.D.   On: 03/05/2018 16:17   Ct Head Wo Contrast  Result Date: 03/05/2018 CLINICAL DATA:  Abnormal labs recent right hip surgery, confusion EXAM: CT HEAD WITHOUT CONTRAST TECHNIQUE: Contiguous axial images were obtained from the base of the skull through the vertex without intravenous contrast. COMPARISON:  CT brain 08/29/2016 FINDINGS: Brain: No acute territorial infarction, hemorrhage or intracranial mass. Atrophy with mild small vessel ischemic changes of the white matter. Stable ventricle size. Vascular: No hyperdense vessels. Carotid vascular and vertebral artery calcification Skull: Normal. Negative for fracture or focal lesion. Sinuses/Orbits: No acute finding. Other: None IMPRESSION: 1. No CT evidence for acute intracranial abnormality. 2. Atrophy with small vessel ischemic changes of the white matter Electronically Signed  By: Jasmine PangKim  Fujinaga M.D.   On: 03/05/2018 16:14   Dg Hip  Unilat W Or Wo Pelvis 2-3 Views Right  Result Date: 03/05/2018 CLINICAL DATA:  Fall from wheelchair right hip pain recent surgery EXAM: DG HIP (WITH OR WITHOUT PELVIS) 2-3V RIGHT COMPARISON:  02/08/2018, 02/07/2018 FINDINGS: Interval intramedullary rod and screw fixation of fractures involving right femoral neck and trochanter. As compared with fluoroscopic images from 02/08/2018, the right femoral neck appears more impacted/foreshortened, is unclear how much of this is related to positioning. No cortical breach of the fixating device is at the femoral head. There may be a healing fracture of the right superior pubic ramus. The distal femoral rod and screws appear intact. IMPRESSION: Status post intramedullary rod and screw fixation of right femoral neck and trochanter fracture. As compared with intraoperative views, the right femoral neck appears more impacted and foreshortened, it is uncertain how much of this is related to positioning. Electronically Signed   By: Jasmine PangKim  Fujinaga M.D.   On: 03/05/2018 18:52     ASSESSMENT AND PLAN:   84 yo female w/ hx of dementia, A. Fib, CAD, s/p pacemaker with hx of a. Fib, HTN, Hyperlipidemia, status post recent fall with hip fracture with hip replacement and postop infection who presents to the hospital from a skilled nursing facility due to abnormal blood work and noted to have a supratherapeutic INR.  1.  Supratherapeutic INR- patient presented to the hospital with INR over 10.  Patient is on Coumadin for history of atrial fibrillation.  Patient received some vitamin K and INR has normalized now. -No acute bleeding.  Antibiotics were changed from Bactrim to oral doxycycline.  2.  Status post recent fall with hip fracture with intramedullary pinning postop wound infection to the right hip- - Continue oral antibiotics and have been changed from Bactrim to doxycycline due to drug interaction with Coumadin.  Clinically patient is stable.  3.  Chronic H  fibrillation-rate controlled.  Continue metoprolol. - Given her advanced age and high fall risk I would indefinitely continue Coumadin.  Discussed this with the daughter over the phone.  Will refer to cardiology as an outpatient.  4.  Essential hypertension-continue Norvasc, metoprolol.  5.  Chronic anemia-continue iron supplements.  6. Dementia - cont. Seroquel.     All the records are reviewed and case discussed with Care Management/Social Worker. Management plans discussed with the patient, family and they are in agreement.  CODE STATUS: DNR  DVT Prophylaxis: Coumadin  TOTAL TIME TAKING CARE OF THIS PATIENT: 30 minutes.   POSSIBLE D/C IN 1-2 DAYS, DEPENDING ON CLINICAL CONDITION.   [place] SirenSAINANI,Janautica Netzley J M.D on 03/06/2018 at 12:56 PM  Between 7am to 6pm - Pager - [phone]  After 6pm go to [email or web address] - Social research officer, governmentpassword EPAS [email or web address]  [facility] [place] [facility]  Office  [phone]  CC: Primary care physician; Dorothey BasemanBronstein, David, MD

## 2018-03-06 NOTE — Clinical Social Work Note (Signed)
Clinical Social Work Assessment  Patient Details  Name: Ashley Mullins MRN: [id number] Date of Birth: 22-Jan-1929  Date of referral:  03/06/18               Reason for consult:  Facility Placement, Discharge Planning                Permission sought to share information with:  Facility Art therapist granted to share information::  Yes, Verbal Permission Granted  Name::        Agency::  [facility]  Relationship::     Contact Information:     Housing/Transportation Living arrangements for the past 2 months:  [place] of Information:  Adult Children, Scientist, water quality, Facility Patient Interpreter Needed:  None Criminal Activity/Legal Involvement Pertinent to Current Situation/Hospitalization:  No - Comment as needed Significant Relationships:  Adult Children, Warehouse manager Lives with:  Facility Resident Do you feel safe going back to the place where you live?  Yes Need for family participation in patient care:  No (Coment)  Care giving concerns:  Patient admitted from [facility]   Social Worker assessment / plan:  The CSW met with the patient's family outside of the room to discuss discharge planning. The family indicated that they are concerned about medications that the patient is receiving. The CSW advised that medication rectification would be the purview of the attending MD. The CSW explained that [facility] administrator Otila Kluver has confirmed that the patient can return tomorrow. The family is in agreement with this plan. The CSW explained that [facility] has set an end date to PT for 03/09/18 but are willing to reevaluate the patient for continued PT should she show ability to participate in rehab. The CSW advised that [facility] also mentioned that the family had been in discussion with 2 different ALFs for long term care. The family confirmed. The CSW will continue to follow for discharge facilitation tomorrow.   Employment status:   Retired Forensic scientist:  Medicare PT Recommendations:  Not assessed at this time Information / Referral to community resources:     Patient/Family's Response to care: The patient's family seems overwhelmed; however, they thanked the CSW for assistance.  Patient/Family's Understanding of and Emotional Response to Diagnosis, Current Treatment, and Prognosis:  The patient's family are frustrated and seem to have limited insight into the needs of the patient and her long term prognosis. They are in agreement with return to [facility] tomorrow.  Emotional Assessment Appearance:  Appears stated age Attitude/Demeanor/Rapport:  Lethargic Affect (typically observed):  Stable Orientation:  Oriented to Self Alcohol / Substance use:  Never Used Psych involvement (Current and /or in the community):  No (Comment)  Discharge Needs  Concerns to be addressed:  Adjustment to Illness, Discharge Planning Concerns Readmission within the last 30 days:  Yes Current discharge risk:  Chronically ill Barriers to Discharge:  Continued Medical Work up   [employer], LCSW 03/06/2018, 3:55 PM

## 2018-03-06 NOTE — NC FL2 (Signed)
[place] MEDICAID FL2 LEVEL OF CARE SCREENING TOOL     IDENTIFICATION  Patient Name: Ashley Mullins Birthdate: 10/07/1928 Sex: female Admission Date (Current Location): 03/05/2018  [place]   Facility and Address:  [facility], [address], [place], [place] [address]      Provider Number: [id number]  Attending Physician Name and Address:  [place] SirenSainani, Vivek J, MD  Relative Name and Phone Number:  Brigitte Pulseeresa Richter (Daughter) [phone]    Current Level of Care: Hospital Recommended Level of Care: Skilled Nursing Facility Prior Approval Number:    Date Approved/Denied:   PASRR Number: [id number]  Discharge Plan: SNF    Current Diagnoses: Patient Active Problem List   Diagnosis Date Noted  . Supratherapeutic INR 03/05/2018  . Closed right hip fracture (HCC) 02/07/2018  . Dementia with psychosis 08/29/2016    Orientation RESPIRATION BLADDER Height & Weight     Self  Normal Incontinent Weight: 136 lb 14.4 oz (62.1 kg) Height:  5\' 4"  (162.6 cm)  BEHAVIORAL SYMPTOMS/MOOD NEUROLOGICAL BOWEL NUTRITION STATUS      Incontinent Diet(Soft, thin liquids)  AMBULATORY STATUS COMMUNICATION OF NEEDS Skin   Extensive Assist Verbally Surgical wounds                       Personal Care Assistance Level of Assistance  Bathing, Feeding, Dressing Bathing Assistance: Maximum assistance Feeding assistance: Maximum assistance Dressing Assistance: Maximum assistance     Functional Limitations Info  Sight, Hearing, Speech Sight Info: Adequate Hearing Info: Adequate Speech Info: Adequate    SPECIAL CARE FACTORS FREQUENCY  PT (By licensed PT)     PT Frequency: Up to 5X per week              Contractures Contractures Info: Not present    Additional Factors Info  Code Status, Allergies, Psychotropic Code Status Info: DNR Allergies Info: Chicken Allergy, Codeine, Ibuprofen, Other Psychotropic Info:  Seroquel         Current Medications (03/06/2018):  This is the current hospital active medication list Current Facility-Administered Medications  Medication Dose Route Frequency Provider Last Rate Last Dose  . acetaminophen (TYLENOL) tablet 650 mg  650 mg Oral Q6H PRN Mayo, Allyn KennerKaty Dodd, MD      . amLODipine (NORVASC) tablet 5 mg  5 mg Oral Daily Mayo, Allyn KennerKaty Dodd, MD   5 mg at 03/06/18 [phone]  . atorvastatin (LIPITOR) tablet 80 mg  80 mg Oral Daily Mayo, Allyn KennerKaty Dodd, MD   80 mg at 03/06/18 [id number]  . docusate sodium (COLACE) capsule 100 mg  100 mg Oral BID Campbell StallMayo, Katy Dodd, MD   100 mg at 03/06/18 [id number]  . doxycycline (VIBRA-TABS) tablet 100 mg  100 mg Oral Q12H Mayo, Allyn KennerKaty Dodd, MD   100 mg at 03/06/18 0949  . feeding supplement (PRO-STAT SUGAR FREE 64) liquid 15 mL  15 mL Oral BID Campbell StallMayo, Katy Dodd, MD   15 mL at 03/06/18 0949  . ferrous sulfate tablet 325 mg  325 mg Oral Q breakfast Mayo, Allyn KennerKaty Dodd, MD   325 mg at 03/06/18 [id number]  . furosemide (LASIX) tablet 40 mg  40 mg Oral Daily Mayo, Allyn KennerKaty Dodd, MD   40 mg at 03/06/18 0937  . metoprolol tartrate (LOPRESSOR) tablet 50 mg  50 mg Oral BID Campbell StallMayo, Katy Dodd, MD   50 mg at 03/06/18 [id number]  . nitroGLYCERIN (NITROSTAT) SL tablet 0.4 mg  0.4 mg Sublingual Q5 min PRN Mayo,  Allyn Kenner, MD      . ondansetron [facility]) tablet 4 mg  4 mg Oral Q6H PRN Mayo, Allyn Kenner, MD       Or  . ondansetron [facility]) injection 4 mg  4 mg Intravenous Q6H PRN Mayo, Allyn Kenner, MD      . polyethylene glycol (MIRALAX / GLYCOLAX) packet 17 g  17 g Oral Daily PRN Mayo, Allyn Kenner, MD      . QUEtiapine (SEROQUEL) tablet 100 mg  100 mg Oral QHS Sainani, Rolly Pancake, MD      . QUEtiapine (SEROQUEL) tablet 50 mg  50 mg Oral q morning - 10a Mayo, Allyn Kenner, MD      . senna [facility]) tablet 8.6 mg  1 tablet Oral BID Campbell Stall, MD   8.6 mg at 03/06/18 4098  . traMADol (ULTRAM) tablet 50 mg  50 mg Oral Q6H PRN Mayo, Allyn Kenner, MD         Discharge Medications: Please see discharge summary for a  list of discharge medications.  Relevant Imaging Results:  Relevant Lab Results:   Additional Information SS#[id number]  Judi Cong, LCSW

## 2018-03-06 NOTE — Progress Notes (Addendum)
Seroquel held and with MD notified related to sedation like affect and family concern. Slept at long intervals; arousable. Family in and spoke with MD and SW regarding concerns related to medications and discharge needs/plans. No sign bleeding.

## 2018-03-07 DIAGNOSIS — R791 Abnormal coagulation profile: Secondary | ICD-10-CM | POA: Diagnosis not present

## 2018-03-07 LAB — PROTIME-INR
INR: 1.29
PROTHROMBIN TIME: 16 s — AB (ref 11.4–15.2)

## 2018-03-07 LAB — BASIC METABOLIC PANEL
Anion gap: 6 (ref 5–15)
BUN: 29 mg/dL — AB (ref 8–23)
CHLORIDE: 106 mmol/L (ref 98–111)
CO2: 26 mmol/L (ref 22–32)
CREATININE: 1.27 mg/dL — AB (ref 0.44–1.00)
Calcium: 8 mg/dL — ABNORMAL LOW (ref 8.9–10.3)
GFR calc Af Amer: 43 mL/min — ABNORMAL LOW (ref >60)
GFR calc non Af Amer: 37 mL/min — ABNORMAL LOW (ref >60)
Glucose, Bld: 110 mg/dL — ABNORMAL HIGH (ref 70–99)
POTASSIUM: 3.6 mmol/L (ref 3.5–5.1)
Sodium: 138 mmol/L (ref 135–145)

## 2018-03-07 MED ORDER — ENSURE ENLIVE PO LIQD: 237.0000 mL | Freq: Three times a day (TID) | ORAL | 12 refills | Status: AC

## 2018-03-07 MED ORDER — DOXYCYCLINE HYCLATE 100 MG PO TABS: 100.0000 mg | ORAL_TABLET | Freq: Two times a day (BID) | ORAL | 0 refills | Status: AC

## 2018-03-07 MED ORDER — SODIUM CHLORIDE 0.9% FLUSH
3.0000 mL | Freq: Two times a day (BID) | INTRAVENOUS | Status: DC
  Administered 2018-03-07: 08:00:00 3 mL via INTRAVENOUS

## 2018-03-07 MED ORDER — ENSURE ENLIVE PO LIQD
237.0000 mL | Freq: Two times a day (BID) | ORAL | Status: DC
  Administered 2018-03-07 (×2): 237 mL via ORAL

## 2018-03-07 MED ORDER — QUETIAPINE FUMARATE 25 MG PO TABS: 100.0000 mg | ORAL_TABLET | Freq: Every day | ORAL | Status: AC

## 2018-03-07 MED ORDER — TRAMADOL HCL 50 MG PO TABS: 50.0000 mg | ORAL_TABLET | Freq: Four times a day (QID) | ORAL | 0 refills | Status: AC | PRN

## 2018-03-07 NOTE — Clinical Social Work Note (Signed)
The patient will discharge to room 507 at [facility] today via non emergent EMS. The patient's family and facility are aware and in agreement. The CSW has sent all documentation to the facility, and the discharge packet has been delivered to the chart. The CSW is signing off. Please consult should needs arise.  Argentina PonderKaren Martha Sandia Pfund, MSW, Theresia MajorsLCSWA [phone]

## 2018-03-07 NOTE — Care Management Obs Status (Signed)
MEDICARE OBSERVATION STATUS NOTIFICATION   Patient Details  Name: Ashley Mullins MRN: [id number] Date of Birth: 04/23/1929   Medicare Observation Status Notification Given:  Yes  Explained to daughter [name] over the phone who became very upset because she was told patient was to be admitted  [name], [name], RN 03/07/2018, 9:44 AM

## 2018-03-07 NOTE — Progress Notes (Signed)
Held seroquel due to lethargy, but arousable. Decreased appetite for solid foods; will drink water, juice and ensure.. Denies pain/co's. Pleasantly confused. No change in pressure area right heel with pink foam continued. Right hip incisions healed. INR returned to normal with no sign/symptom bleeding TC with dgt with update on pt condition and discharge plans done with agreeable to discharge to [facility].

## 2018-03-07 NOTE — Discharge Summary (Signed)
[facility] [place] [facility]   PATIENT NAME: Ashley Mullins    MR#:  [id number]  DATE OF BIRTH:  1928-11-16  DATE OF ADMISSION:  03/05/2018 ADMITTING PHYSICIAN: Campbell Stall, MD  DATE OF DISCHARGE: 03/07/2018  PRIMARY CARE PHYSICIAN: Dorothey Baseman, MD    ADMISSION DIAGNOSIS:  Inanition (HCC) [R64] Elevated INR [R79.1] Altered mental status, unspecified altered mental status type [R41.82] Supratherapeutic INR [R79.1]  DISCHARGE DIAGNOSIS:  Active Problems:   Supratherapeutic INR   SECONDARY DIAGNOSIS:   Past Medical History:  Diagnosis Date  . A-fib (HCC)   . CAD (coronary artery disease)   . Dementia   . Depression   . Hyperlipidemia   . Irregular heart beat   . Pacemaker     HOSPITAL COURSE:   84 yo female w/ hx of dementia, A. Fib, CAD, s/p pacemaker with hx of a. Fib, HTN, Hyperlipidemia, status post recent fall with hip fracture with hip replacement and postop infection who presents to the hospital from a skilled nursing facility due to abnormal blood work and noted to have a supratherapeutic INR.  1.  Supratherapeutic INR- patient presented to the hospital with INR over 10.  Patient is on Coumadin for history of atrial fibrillation.  Patient received some vitamin K and INR has normalized now. - she has had No acute bleeding and INR on the day of discharge is 1.2.  She is no longer going to be on coumadin due to high fall risk.   2.  Status post recent fall with hip fracture with intramedullary pinning postop wound infection to the right hip- - pt's abx have been changed from bactrim to Oral Doxycyline due to drug interactions with coumadin.  - pt. Will need 5 more days of abx.    3.  Chronic a. fibrillation-rate controlled.  pt. Will Continue metoprolol. - Given her advanced age and high fall risk I have discontinued her Coumadin.  Discussed this with the daughter over the phone.    4.  Essential hypertension- pt. Will continue  Norvasc, metoprolol.  5.  Chronic anemia-pt. Will continue iron supplements.  6. Dementia - pt. Has been a bit on the lethargic side and it could be related to anti-psychotics she was getting during her previous hospitalization.  She was on high doses of Seroquel and I will put her on just a small dose at bedtime.  - she also now being placed on ensure supplements tid.    She is being discharged back to [facility] today. Discussed with daughter and she is in agreement.  DISCHARGE CONDITIONS:   Stable.   CONSULTS OBTAINED:    DRUG ALLERGIES:   Allergies  Allergen Reactions  . Chicken Allergy   . Codeine   . Ibuprofen Other (See Comments)  . Other     Pt is allergic to [place] and chicken    DISCHARGE MEDICATIONS:   Allergies as of 03/07/2018      Reactions   Chicken Allergy    Codeine    Ibuprofen Other (See Comments)   Other    Pt is allergic to [place] and chicken      Medication List    STOP taking these medications   furosemide 40 MG tablet Commonly known as:  LASIX   warfarin 2 MG tablet Commonly known as:  COUMADIN     TAKE these medications   acetaminophen 650 MG CR tablet Commonly known as:  TYLENOL Take 650 mg by mouth every 6 (six)  hours as needed for pain.   amLODipine 5 MG tablet Commonly known as:  NORVASC Take 5 mg by mouth daily.   atorvastatin 80 MG tablet Commonly known as:  LIPITOR Take 80 mg by mouth daily.   docusate sodium 100 MG capsule Commonly known as:  COLACE Take 1 capsule (100 mg total) by mouth 2 (two) times daily.   doxycycline 100 MG tablet Commonly known as:  VIBRA-TABS Take 1 tablet (100 mg total) by mouth every 12 (twelve) hours for 5 days.   feeding supplement (ENSURE ENLIVE) Liqd Take 237 mLs by mouth 3 (three) times daily between meals.   feeding supplement (PRO-STAT SUGAR FREE 64) Liqd Take 15 mLs by mouth 2 (two) times daily.   ferrous sulfate 325 (65 FE) MG tablet Take 325 mg by mouth daily with  breakfast.   metoprolol tartrate 50 MG tablet Commonly known as:  LOPRESSOR Take 50 mg by mouth 2 (two) times daily.   nitroGLYCERIN 0.4 MG SL tablet Commonly known as:  NITROSTAT Place 0.4 mg under the tongue every 5 (five) minutes as needed for chest pain.   polyethylene glycol packet Commonly known as:  MIRALAX / GLYCOLAX Take 17 g by mouth daily as needed for mild constipation.   potassium chloride 10 MEQ tablet Commonly known as:  K-DUR,KLOR-CON Take 10 mEq by mouth daily.   QUEtiapine 25 MG tablet Commonly known as:  SEROQUEL Take 4 tablets (100 mg total) by mouth at bedtime. What changed:    medication strength  how much to take  when to take this  additional instructions   senna 8.6 MG Tabs tablet Commonly known as:  SENOKOT Take 1 tablet by mouth 2 (two) times daily.   traMADol 50 MG tablet Commonly known as:  ULTRAM Take 1 tablet (50 mg total) by mouth every 6 (six) hours as needed for moderate pain.         DISCHARGE INSTRUCTIONS:   DIET:  Regular diet  DISCHARGE CONDITION:  Stable  ACTIVITY:  Activity as tolerated  OXYGEN:  Home Oxygen: No.   Oxygen Delivery: room air  DISCHARGE LOCATION:  nursing home   If you experience worsening of your admission symptoms, develop shortness of breath, life threatening emergency, suicidal or homicidal thoughts you must seek medical attention immediately by calling 911 or calling your MD immediately  if symptoms less severe.  You Must read complete instructions/literature along with all the possible adverse reactions/side effects for all the Medicines you take and that have been prescribed to you. Take any new Medicines after you have completely understood and accpet all the possible adverse reactions/side effects.   Please note  You were cared for by a hospitalist during your hospital stay. If you have any questions about your discharge medications or the care you received while you were in the  hospital after you are discharged, you can call the unit and asked to speak with the hospitalist on call if the hospitalist that took care of you is not available. Once you are discharged, your primary care physician will handle any further medical issues. Please note that NO REFILLS for any discharge medications will be authorized once you are discharged, as it is imperative that you return to your primary care physician (or establish a relationship with a primary care physician if you do not have one) for your aftercare needs so that they can reassess your need for medications and monitor your lab values.     Today   No  acute events overnight. INR normal now.  Hg. Stable.  A bit more awake and drinking more and than eating and will place on oral ensure supplements.   VITAL SIGNS:  Blood pressure 111/81, pulse 81, temperature 98.1 F (36.7 C), temperature source Oral, resp. rate 18, height 5\' 4"  (1.626 m), weight 62.1 kg, SpO2 96 %.  I/O:    Intake/Output Summary (Last 24 hours) at 03/07/2018 1101 Last data filed at 03/07/2018 0500 Gross per 24 hour  Intake 100 ml  Output 500 ml  Net -400 ml    PHYSICAL EXAMINATION:   GENERAL:  84 y.o.-year-old patient lying in bed lethargic but in NAD.    EYES: Pupils equal, round, reactive to light and accommodation. No scleral icterus. Extraocular muscles intact.  HEENT: Head atraumatic, normocephalic. Oropharynx and nasopharynx clear. Dry Oral Mucosa.  NECK:  Supple, no jugular venous distention. No thyroid enlargement, no tenderness.  LUNGS: Normal breath sounds bilaterally, no wheezing, rales, rhonchi. No use of accessory muscles of respiration.  CARDIOVASCULAR: S1, S2 normal. No murmurs, rubs, or gallops.  ABDOMEN: Soft, nontender, nondistended. Bowel sounds present. No organomegaly or mass.  EXTREMITIES: No cyanosis, clubbing or edema b/l.    NEUROLOGIC: Cranial nerves II through XII are intact. No focal Motor or sensory deficits b/l.  Globally weak.    PSYCHIATRIC: The patient is alert and oriented x 1.  SKIN: No obvious rash, lesion, or ulcer.   DATA REVIEW:   CBC Recent Labs  Lab 03/06/18 0457  WBC 7.2  HGB 8.9*  HCT 26.0*  PLT 288    Chemistries  Recent Labs  Lab 03/06/18 0457 03/07/18 0415  NA 138 138  K 3.9 3.6  CL 107 106  CO2 25 26  GLUCOSE 140* 110*  BUN 27* 29*  CREATININE 1.34* 1.27*  CALCIUM 8.1* 8.0*  AST 37  --   ALT 26  --   ALKPHOS 208*  --   BILITOT 1.0  --     Cardiac Enzymes Recent Labs  Lab 03/05/18 1541  TROPONINI <0.03    Microbiology Results  Results for orders placed or performed during the hospital encounter of 02/07/18  MRSA PCR Screening     Status: None   Collection Time: 02/07/18  9:25 PM  Result Value Ref Range Status   MRSA by PCR NEGATIVE NEGATIVE Final    Comment:        The GeneXpert MRSA Assay (FDA approved for NASAL specimens only), is one component of a comprehensive MRSA colonization surveillance program. It is not intended to diagnose MRSA infection nor to guide or monitor treatment for MRSA infections. Performed at [facility], [address] [place] [place], [place] [address]     RADIOLOGY:  Dg Chest 2 View  Result Date: 03/05/2018 CLINICAL DATA:  Weakness EXAM: CHEST - 2 VIEW COMPARISON:  02/07/2018 FINDINGS: Chronic cardiomegaly. Mitral annular ossification. Dual-chamber pacer leads from the left are in stable position. There is no edema, consolidation, effusion, or pneumothorax. Prominent osteopenia. IMPRESSION: No evidence of active disease. Electronically Signed   By: Marnee Spring M.D.   On: 03/05/2018 16:17   Ct Head Wo Contrast  Result Date: 03/05/2018 CLINICAL DATA:  Abnormal labs recent right hip surgery, confusion EXAM: CT HEAD WITHOUT CONTRAST TECHNIQUE: Contiguous axial images were obtained from the base of the skull through the vertex without intravenous contrast. COMPARISON:  CT brain 08/29/2016 FINDINGS: Brain: No  acute territorial infarction, hemorrhage or intracranial mass. Atrophy with mild small vessel ischemic changes  of the white matter. Stable ventricle size. Vascular: No hyperdense vessels. Carotid vascular and vertebral artery calcification Skull: Normal. Negative for fracture or focal lesion. Sinuses/Orbits: No acute finding. Other: None IMPRESSION: 1. No CT evidence for acute intracranial abnormality. 2. Atrophy with small vessel ischemic changes of the white matter Electronically Signed   By: Jasmine PangKim  Fujinaga M.D.   On: 03/05/2018 16:14   Dg Hip Unilat W Or Wo Pelvis 2-3 Views Right  Result Date: 03/05/2018 CLINICAL DATA:  Fall from wheelchair right hip pain recent surgery EXAM: DG HIP (WITH OR WITHOUT PELVIS) 2-3V RIGHT COMPARISON:  02/08/2018, 02/07/2018 FINDINGS: Interval intramedullary rod and screw fixation of fractures involving right femoral neck and trochanter. As compared with fluoroscopic images from 02/08/2018, the right femoral neck appears more impacted/foreshortened, is unclear how much of this is related to positioning. No cortical breach of the fixating device is at the femoral head. There may be a healing fracture of the right superior pubic ramus. The distal femoral rod and screws appear intact. IMPRESSION: Status post intramedullary rod and screw fixation of right femoral neck and trochanter fracture. As compared with intraoperative views, the right femoral neck appears more impacted and foreshortened, it is uncertain how much of this is related to positioning. Electronically Signed   By: Jasmine PangKim  Fujinaga M.D.   On: 03/05/2018 18:52      Management plans discussed with the patient, family and they are in agreement.  CODE STATUS:     Code Status Orders  (From admission, onward)         Start     Ordered   03/05/18 2128  Do not attempt resuscitation (DNR)  Continuous    Question Answer Comment  In the event of cardiac or respiratory ARREST Do not call a "code blue"   In the event  of cardiac or respiratory ARREST Do not perform Intubation, CPR, defibrillation or ACLS   In the event of cardiac or respiratory ARREST Use medication by any route, position, wound care, and other measures to relive pain and suffering. May use oxygen, suction and manual treatment of airway obstruction as needed for comfort.      03/05/18 2128          TOTAL TIME TAKING CARE OF THIS PATIENT: 40 minutes.    Houston SirenSAINANI,Ashanta Amoroso J M.D on 03/07/2018 at 11:01 AM  Between 7am to 6pm - Pager - [phone]  After 6pm go to [email or web address] - Social research officer, [email or web address] [email or web address]  [facility] [place] [facility]  Office  [phone]  CC: Primary care physician; Dorothey BasemanBronstein, David, MD

## 2018-03-07 NOTE — Care Management (Signed)
Patient's daughter [name] and daughter's husband or patient's son son (not sure which was on the phone) verbalize very strongly the dis satisfaction that were informed that she was told in the ED that the patient is being admitted and "nothing about observation."  She also relays that she was told by the social work er and at [facility] that patient's skilled nursing days would start over when she returned to [facility].  CM instructed the the physical therapy would resume but the the "number of days without copays would not start over." She continues to repeat she was told the days would start over and nobody knows what they are talking about. She discusses that her mother has not had a chance to really participate with physical therapy at [facility] because "she was on too many psychotics."  While CM was attempting to respond verbally to [name], the gentleman got on the line and started discussing that the physicians at [facility] gave patient an antibiotic that raised her coumadin level. He then asked to speak with CM supervisor because CM "was talking over him and that is not respectful."   He mentions calling  a lawyer and filing a law suit.  CM notified house supervisor.

## 2018-03-07 NOTE — Progress Notes (Signed)
Pt ate yogurt and ensure for lunch. [facility] here and transporting pt to [facility]. TC with dgt notified that pt is leaving currently for transport. Discharge to [facility]; SNF via ambulance transport. Pt belongings sent with pt.

## 2018-05-23 ENCOUNTER — Other Ambulatory Visit
Admission: RE | Admit: 2018-05-23 | Discharge: 2018-05-23 | Disposition: A | Discharge status: Home / Self Care | Payer: Medicare Other | Source: Other Acute Inpatient Hospital | Attending: Family Medicine | Admitting: Family Medicine

## 2018-05-23 DIAGNOSIS — R509 Fever, unspecified: Secondary | ICD-10-CM | POA: Insufficient documentation

## 2018-05-23 LAB — BASIC METABOLIC PANEL
Anion gap: 6 (ref 5–15)
BUN: 18 mg/dL (ref 8–23)
CALCIUM: 7.5 mg/dL — AB (ref 8.9–10.3)
CHLORIDE: 106 mmol/L (ref 98–111)
CO2: 25 mmol/L (ref 22–32)
Creatinine, Ser: 0.84 mg/dL (ref 0.44–1.00)
Glucose, Bld: 126 mg/dL — ABNORMAL HIGH (ref 70–99)
Potassium: 4 mmol/L (ref 3.5–5.1)
SODIUM: 137 mmol/L (ref 135–145)

## 2018-05-23 LAB — URINALYSIS, COMPLETE (UACMP) WITH MICROSCOPIC
BILIRUBIN URINE: NEGATIVE
GLUCOSE, UA: NEGATIVE mg/dL
Hgb urine dipstick: NEGATIVE
Ketones, ur: NEGATIVE mg/dL
Nitrite: NEGATIVE
PH: 6 (ref 5.0–8.0)
Protein, ur: NEGATIVE mg/dL
SPECIFIC GRAVITY, URINE: 1.017 (ref 1.005–1.030)
WBC, UA: >50 WBC/hpf — ABNORMAL HIGH (ref 0–5)

## 2018-05-23 LAB — CBC WITH DIFFERENTIAL/PLATELET
Abs Immature Granulocytes: 0.04 10*3/uL (ref 0.00–0.07)
BASOS PCT: 0 %
Basophils Absolute: 0 10*3/uL (ref 0.0–0.1)
EOS PCT: 1 %
Eosinophils Absolute: 0.1 10*3/uL (ref 0.0–0.5)
HCT: 29.1 % — ABNORMAL LOW (ref 36.0–46.0)
HEMOGLOBIN: 8.9 g/dL — AB (ref 12.0–15.0)
Immature Granulocytes: 1 %
LYMPHS PCT: 31 %
Lymphs Abs: 2.2 10*3/uL (ref 0.7–4.0)
MCH: 25.6 pg — ABNORMAL LOW (ref 26.0–34.0)
MCHC: 30.6 g/dL (ref 30.0–36.0)
MCV: 83.6 fL (ref 80.0–100.0)
Monocytes Absolute: 0.7 10*3/uL (ref 0.1–1.0)
Monocytes Relative: 10 %
NRBC: 0 % (ref 0.0–0.2)
Neutro Abs: 4.1 10*3/uL (ref 1.7–7.7)
Neutrophils Relative %: 57 %
PLATELETS: 242 10*3/uL (ref 150–400)
RBC: 3.48 MIL/uL — AB (ref 3.87–5.11)
RDW: 18.4 % — ABNORMAL HIGH (ref 11.5–15.5)
WBC: 7.1 10*3/uL (ref 4.0–10.5)

## 2018-05-25 LAB — URINE CULTURE: CULTURE: NO GROWTH

## 2018-09-15 DEATH — deceased

## 2020-02-27 IMAGING — CR DG HIP (WITH PELVIS) OPERATIVE*R*
4 series · 4 of 4 positions shown · non-contrast
Comparison: Preoperative study of today's date

CLINICAL DATA: Status post ORIF for a comminuted subcapital, neck,
and intertrochanteric fracture of the right hip.

EXAM:
OPERATIVE right HIP (WITH PELVIS IF PERFORMED) 4 VIEWS
TECHNIQUE: Fluoroscopic spot image(s) were submitted for interpretation
post-operatively.

[cont. (1 of 4)]
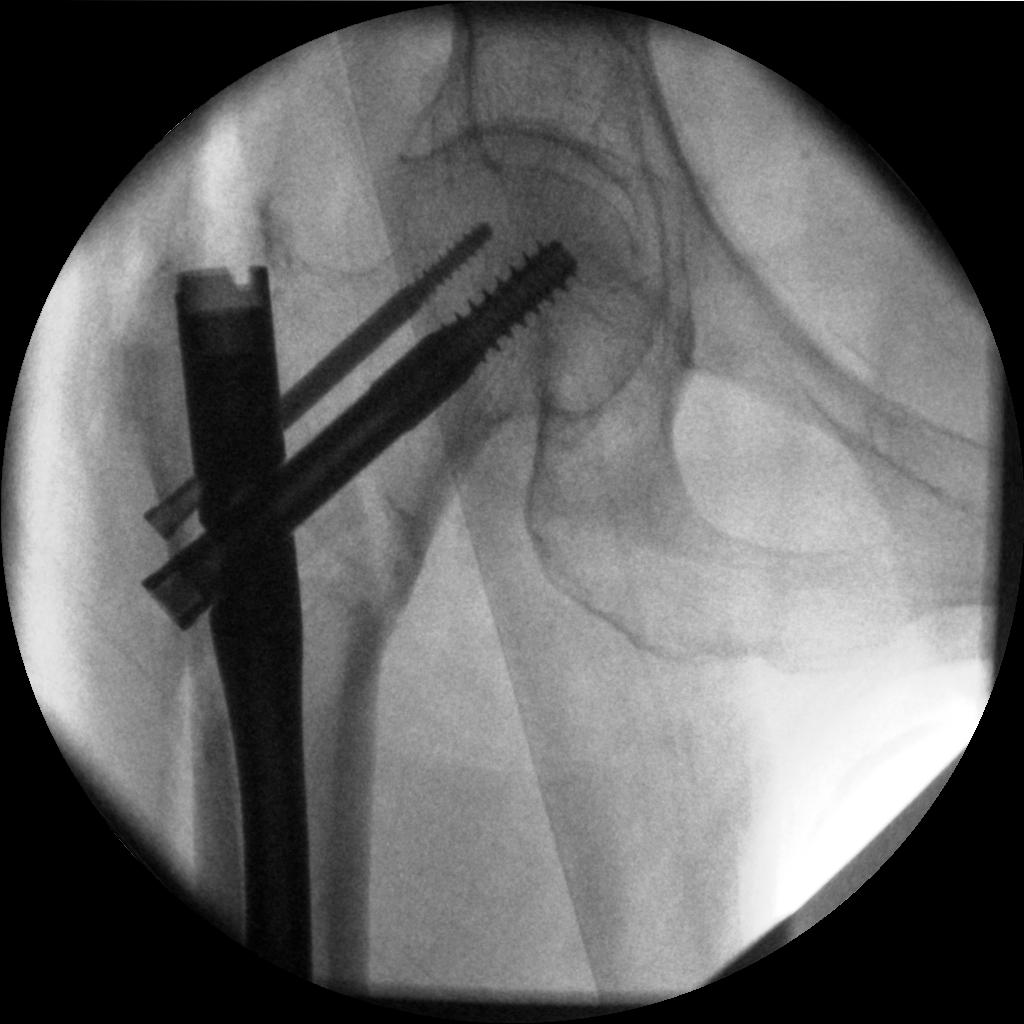

[cont. (2 of 4)]
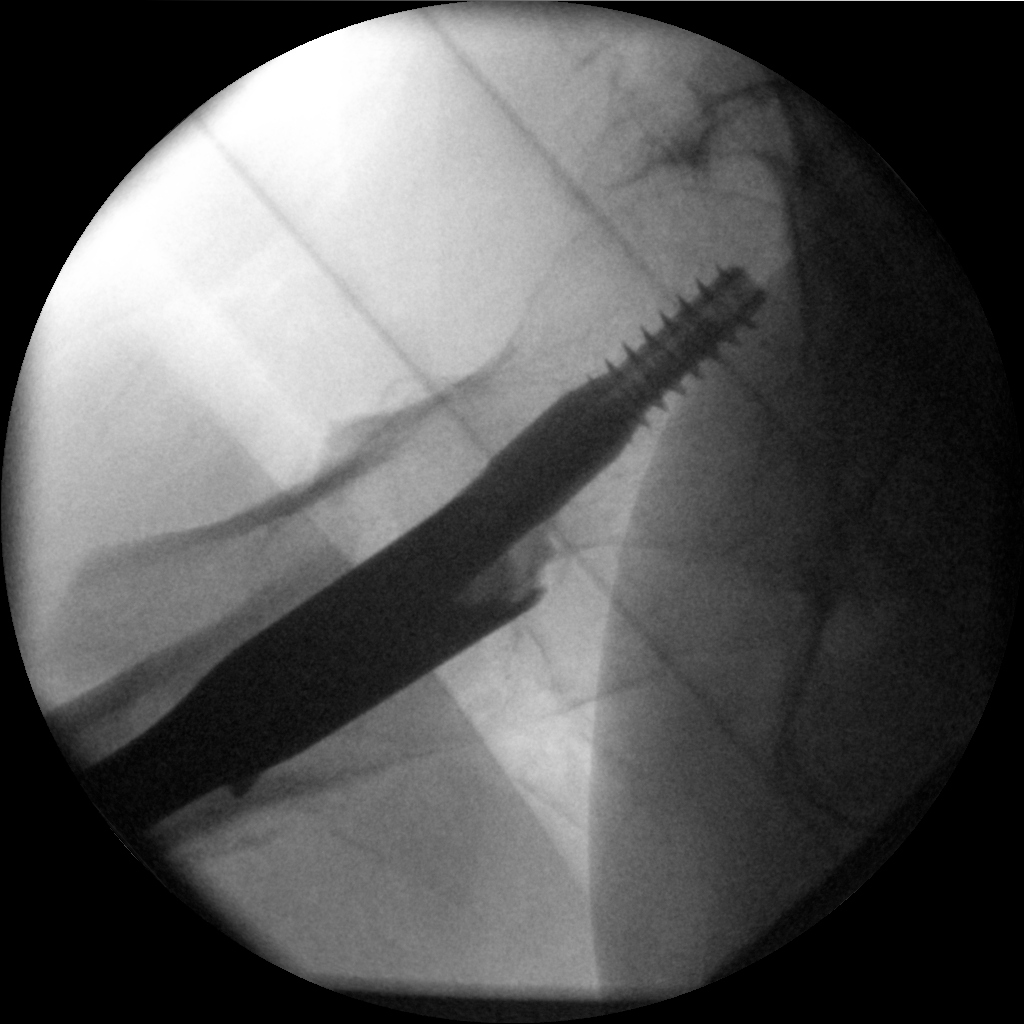

[cont. (3 of 4)]
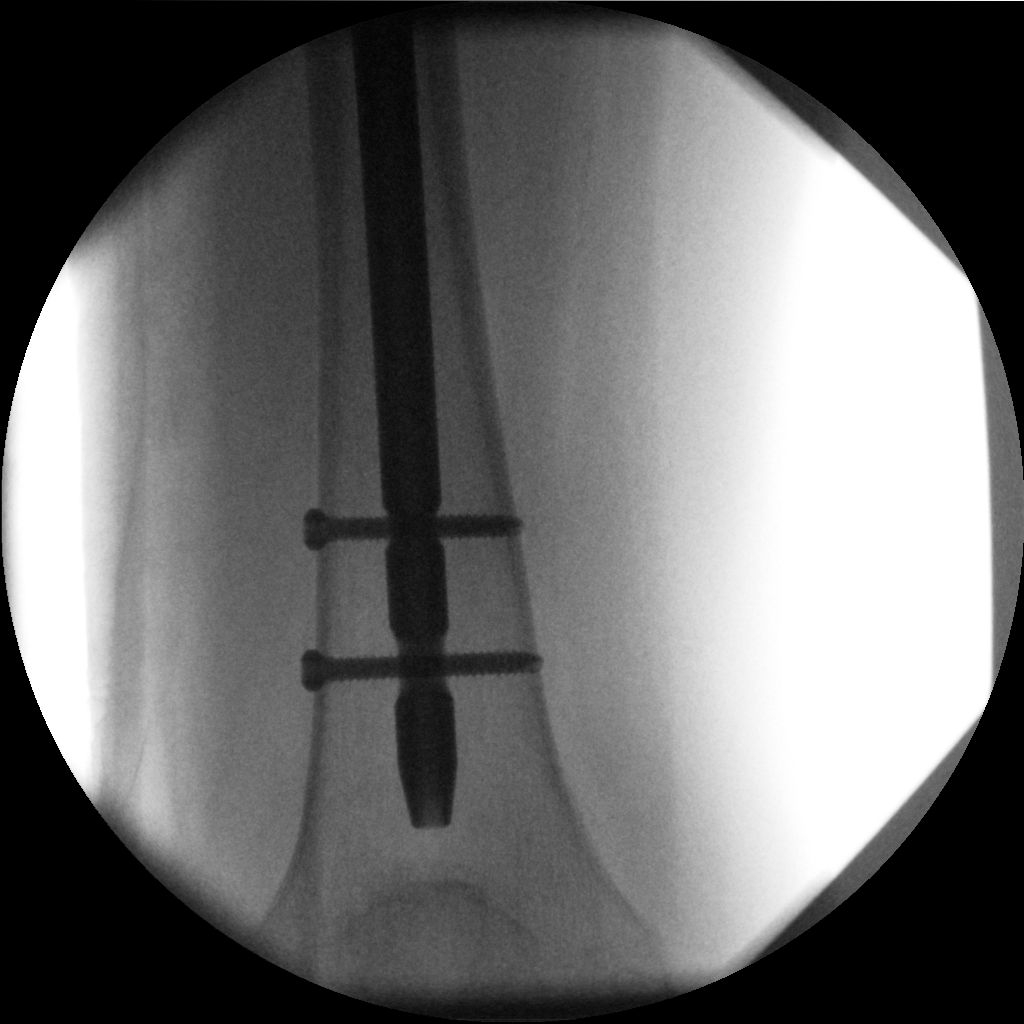

[cont. (4 of 4)]
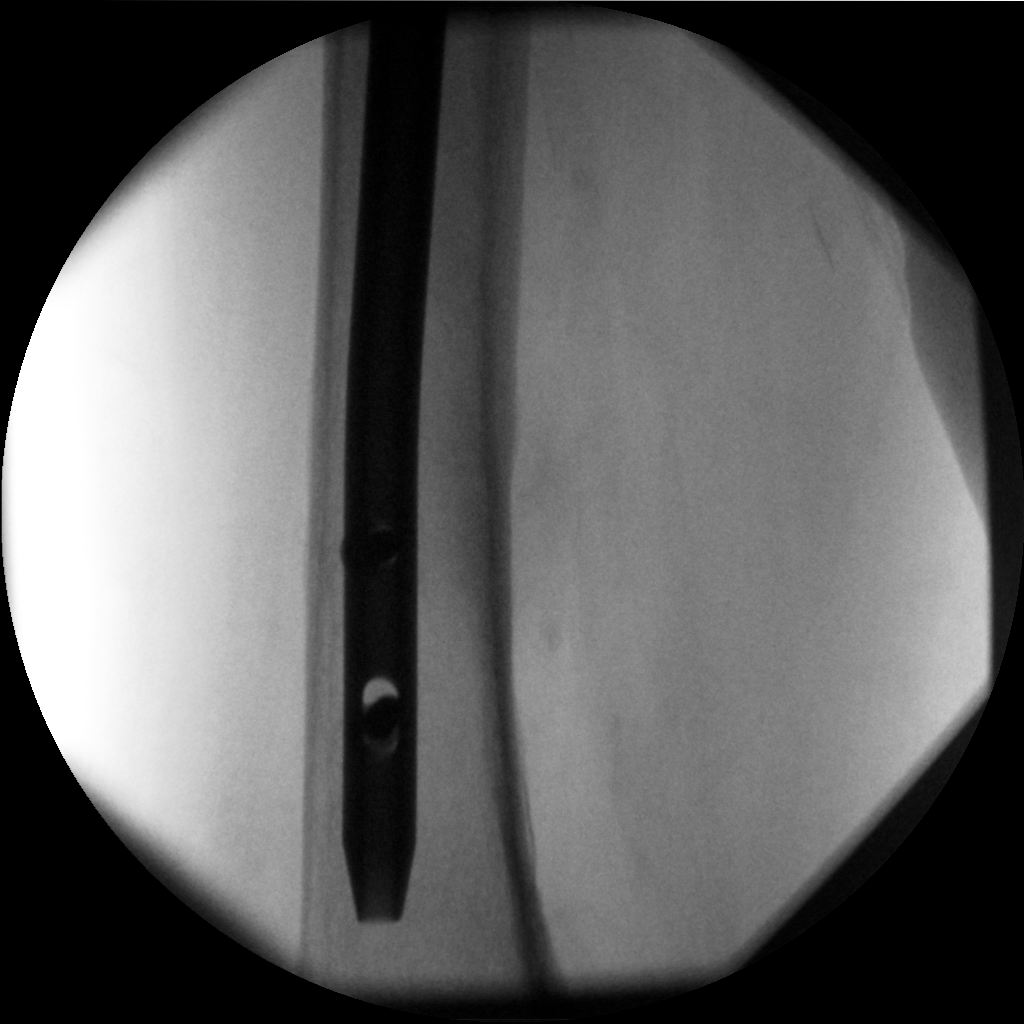

[4 of 4 positions shown; findings below may reference images not displayed]

FINDINGS: Reported fluoro time is 1 minutes, 59 seconds. The patient has
undergone placement of an intramedullary rod and cortical screws for
fixation of the intertrochanteric fracture of the right hip.
Alignment is now near anatomic. No immediate postprocedure
complication is observed.
IMPRESSION: Status post ORIF for an intertrochanteric fracture of the right
femur. Alignment is now near anatomic.

## 2020-02-27 IMAGING — DX DG HIP (WITH OR WITHOUT PELVIS) 1V PORT*R*
1 series · 1 of 1 positions shown · non-contrast
Comparison: 02/07/2018

CLINICAL DATA: Known right proximal femoral fracture

EXAM:
DG HIP (WITH OR WITHOUT PELVIS) 1V PORT RIGHT

[femur lat]
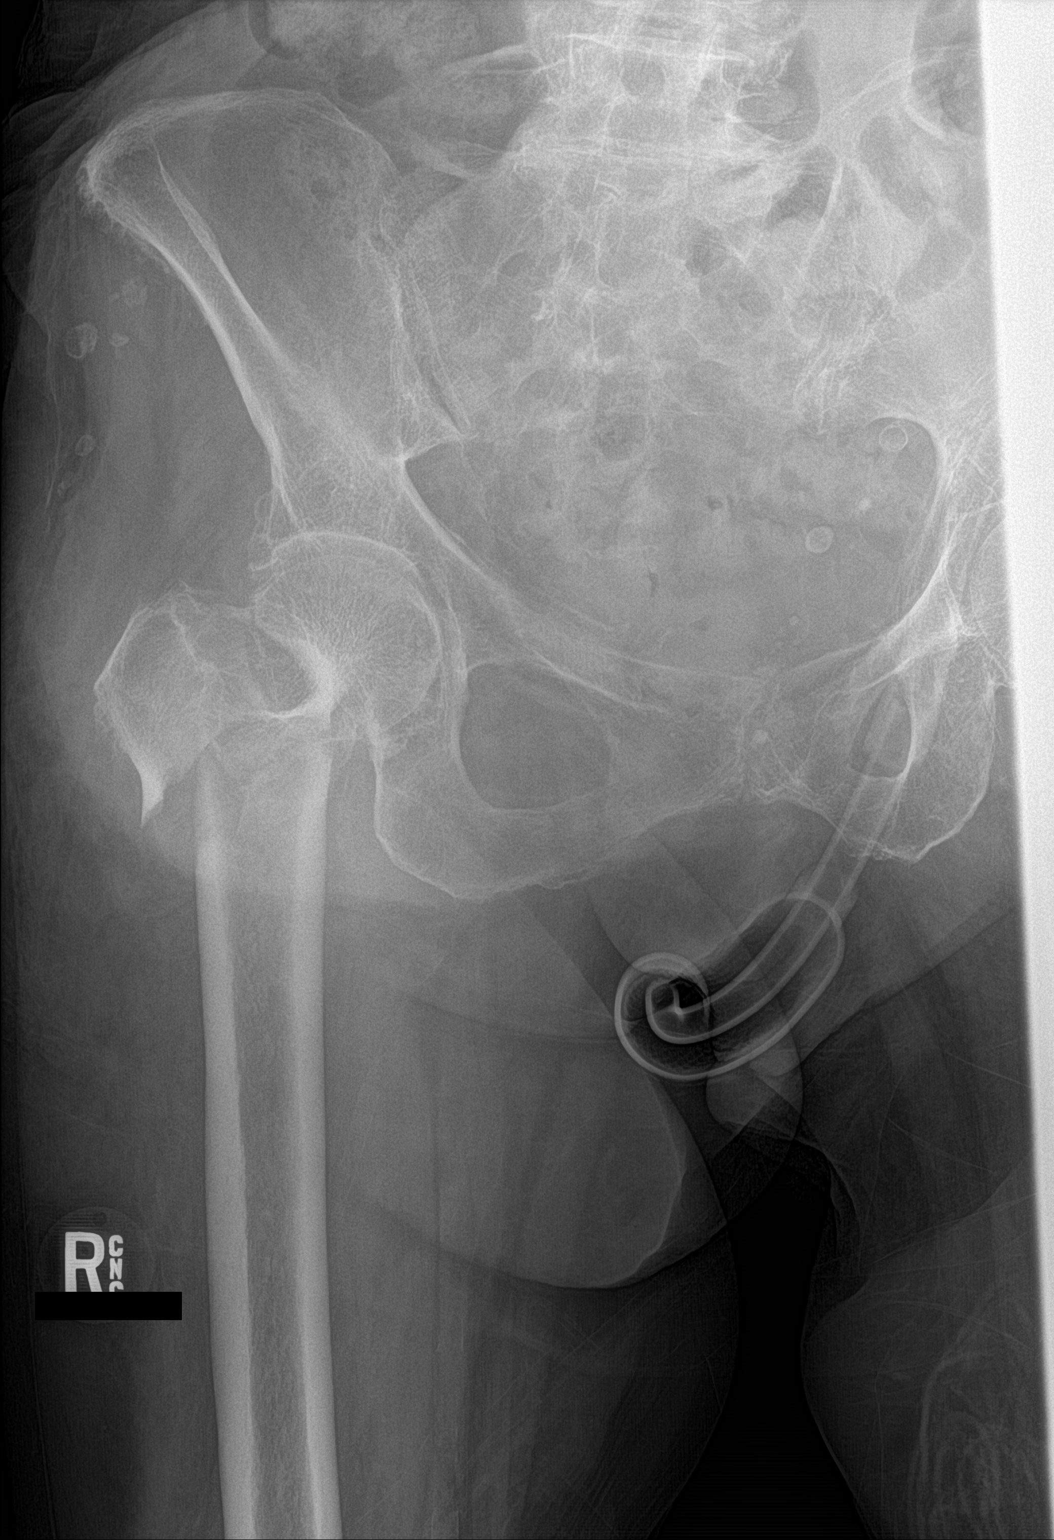

[1 of 1 positions shown; findings below may reference images not displayed]

FINDINGS: Single frontal view of the right hip reveals the known proximal
right femoral fracture with both subcapital and intratrochanteric
components. Increase in the degree of impaction is noted when
compared with the prior exam.
IMPRESSION: Slight increase in impaction at the fracture site in the proximal
right femur.
